# Patient Record
Sex: Female | Born: 1980 | Race: Black or African American | Hispanic: No | Marital: Married | Smoking: Never smoker
Health system: Southern US, Community
[De-identification: ages and names within clinical notes are randomized; demographics above are authoritative.]

## PROBLEM LIST (undated history)

## (undated) DIAGNOSIS — J45909 Unspecified asthma, uncomplicated: Secondary | ICD-10-CM

## (undated) DIAGNOSIS — I10 Essential (primary) hypertension: Secondary | ICD-10-CM

## (undated) HISTORY — DX: Essential (primary) hypertension: I10

## (undated) HISTORY — DX: Unspecified asthma, uncomplicated: J45.909

## (undated) HISTORY — PX: NO PAST SURGERIES: SHX2092

---
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
## 2002-02-27 ENCOUNTER — Inpatient Hospital Stay (HOSPITAL_COMMUNITY): Admission: AD | Admit: 2002-02-27 | Discharge: 2002-02-27 | Payer: Self-pay | Admitting: *Deleted

## 2002-07-05 ENCOUNTER — Inpatient Hospital Stay (HOSPITAL_COMMUNITY): Admission: AD | Admit: 2002-07-05 | Discharge: 2002-07-05 | Payer: Self-pay | Admitting: *Deleted

## 2002-09-23 ENCOUNTER — Emergency Department (HOSPITAL_COMMUNITY): Admission: AC | Admit: 2002-09-23 | Discharge: 2002-09-23 | Payer: Self-pay

## 2002-10-08 ENCOUNTER — Ambulatory Visit (HOSPITAL_COMMUNITY): Admission: RE | Admit: 2002-10-08 | Discharge: 2002-10-08 | Payer: Self-pay | Admitting: Obstetrics & Gynecology

## 2002-10-08 ENCOUNTER — Encounter: Payer: Self-pay | Admitting: Obstetrics & Gynecology

## 2003-01-03 ENCOUNTER — Inpatient Hospital Stay (HOSPITAL_COMMUNITY): Admission: AD | Admit: 2003-01-03 | Discharge: 2003-01-03 | Payer: Self-pay | Admitting: Obstetrics & Gynecology

## 2003-01-03 ENCOUNTER — Encounter: Payer: Self-pay | Admitting: Obstetrics & Gynecology

## 2003-01-05 ENCOUNTER — Encounter: Payer: Self-pay | Admitting: Obstetrics & Gynecology

## 2003-01-05 ENCOUNTER — Inpatient Hospital Stay (HOSPITAL_COMMUNITY): Admission: AD | Admit: 2003-01-05 | Discharge: 2003-01-05 | Payer: Self-pay | Admitting: Obstetrics & Gynecology

## 2003-02-06 ENCOUNTER — Encounter: Payer: Self-pay | Admitting: Obstetrics & Gynecology

## 2003-02-06 ENCOUNTER — Ambulatory Visit (HOSPITAL_COMMUNITY): Admission: RE | Admit: 2003-02-06 | Discharge: 2003-02-06 | Payer: Self-pay | Admitting: Obstetrics & Gynecology

## 2003-02-20 ENCOUNTER — Inpatient Hospital Stay (HOSPITAL_COMMUNITY): Admission: AD | Admit: 2003-02-20 | Discharge: 2003-02-22 | Payer: Self-pay | Admitting: Obstetrics & Gynecology

## 2003-02-27 ENCOUNTER — Emergency Department (HOSPITAL_COMMUNITY): Admission: EM | Admit: 2003-02-27 | Discharge: 2003-02-27 | Payer: Self-pay | Admitting: Emergency Medicine

## 2005-01-11 ENCOUNTER — Emergency Department (HOSPITAL_COMMUNITY): Admission: EM | Admit: 2005-01-11 | Discharge: 2005-01-11 | Payer: Self-pay | Admitting: Emergency Medicine

## 2005-01-13 ENCOUNTER — Emergency Department (HOSPITAL_COMMUNITY): Admission: EM | Admit: 2005-01-13 | Discharge: 2005-01-13 | Payer: Self-pay | Admitting: Emergency Medicine

## 2006-04-29 ENCOUNTER — Inpatient Hospital Stay (HOSPITAL_COMMUNITY): Admission: AD | Admit: 2006-04-29 | Discharge: 2006-04-29 | Payer: Self-pay | Admitting: Obstetrics & Gynecology

## 2006-08-14 ENCOUNTER — Inpatient Hospital Stay (HOSPITAL_COMMUNITY): Admission: AD | Admit: 2006-08-14 | Discharge: 2006-08-14 | Payer: Self-pay | Admitting: Obstetrics

## 2006-08-31 ENCOUNTER — Inpatient Hospital Stay (HOSPITAL_COMMUNITY): Admission: AD | Admit: 2006-08-31 | Discharge: 2006-09-02 | Payer: Self-pay | Admitting: Obstetrics

## 2009-07-28 ENCOUNTER — Emergency Department (HOSPITAL_COMMUNITY): Admission: EM | Admit: 2009-07-28 | Discharge: 2009-07-28 | Payer: Self-pay | Admitting: Emergency Medicine

## 2009-09-29 ENCOUNTER — Ambulatory Visit (HOSPITAL_COMMUNITY): Admission: RE | Admit: 2009-09-29 | Discharge: 2009-09-29 | Payer: Self-pay | Admitting: Obstetrics

## 2010-03-27 ENCOUNTER — Inpatient Hospital Stay (HOSPITAL_COMMUNITY): Admission: AD | Admit: 2010-03-27 | Discharge: 2010-03-28 | Payer: Self-pay | Admitting: Obstetrics

## 2010-04-08 ENCOUNTER — Inpatient Hospital Stay (HOSPITAL_COMMUNITY): Admission: AD | Admit: 2010-04-08 | Discharge: 2010-04-10 | Payer: Self-pay | Admitting: Obstetrics

## 2010-08-18 LAB — CBC
HCT: 32.6 % — ABNORMAL LOW (ref 36.0–46.0)
Hemoglobin: 11 g/dL — ABNORMAL LOW (ref 12.0–15.0)
Hemoglobin: 11.6 g/dL — ABNORMAL LOW (ref 12.0–15.0)
MCH: 33.9 pg (ref 26.0–34.0)
MCHC: 33.9 g/dL (ref 30.0–36.0)
Platelets: 145 10*3/uL — ABNORMAL LOW (ref 150–400)
RBC: 3.25 MIL/uL — ABNORMAL LOW (ref 3.87–5.11)
RBC: 3.4 MIL/uL — ABNORMAL LOW (ref 3.87–5.11)
RDW: 14.5 % (ref 11.5–15.5)

## 2010-08-28 LAB — POCT URINALYSIS DIP (DEVICE)
Bilirubin Urine: NEGATIVE
Ketones, ur: 40 mg/dL — AB
Specific Gravity, Urine: 1.02 (ref 1.005–1.030)
Urobilinogen, UA: 0.2 mg/dL (ref 0.0–1.0)
pH: 6 (ref 5.0–8.0)

## 2010-08-28 LAB — GC/CHLAMYDIA PROBE AMP, GENITAL
Chlamydia, DNA Probe: NEGATIVE
GC Probe Amp, Genital: NEGATIVE

## 2010-12-14 IMAGING — US US OB COMP LESS 14 WK
1 series · 14 of 28 positions shown · non-contrast
Comparison: none

OBSTETRICAL ULTRASOUND:
 This ultrasound exam was performed in the [HOSPITAL] Ultrasound Department.  The OB US report was generated in the AS system, and faxed to the ordering physician.  This report is also available in [HOSPITAL]?s AccessANYware and in [REDACTED] PACS.

[Series 1: us ob comp less 14 wks · 0.22mm/px · 14 of 28 slices shown]
[im 2/28]
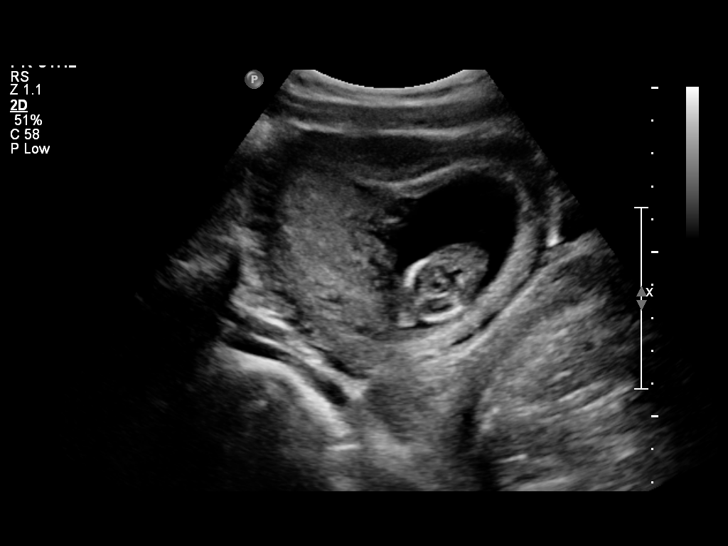
[im 4/28]
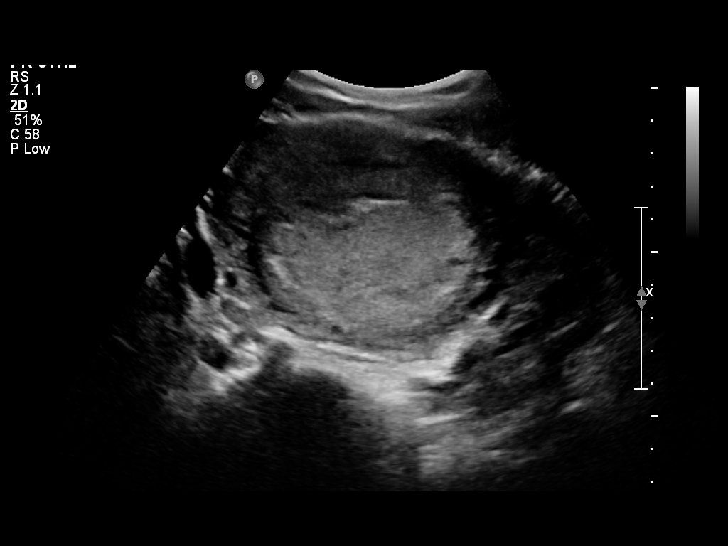
[im 6/28]
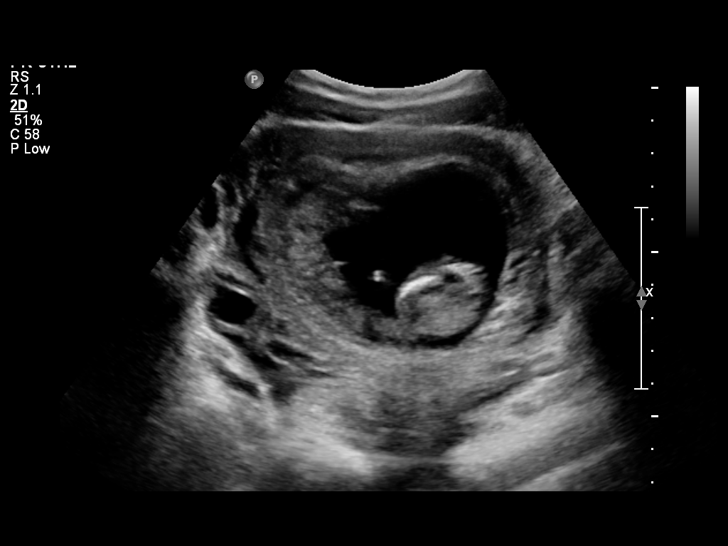
[im 8/28]
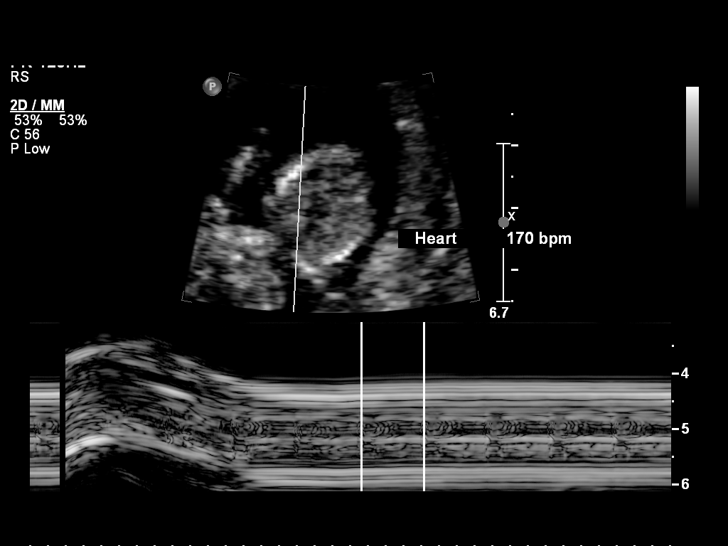
[im 10/28]
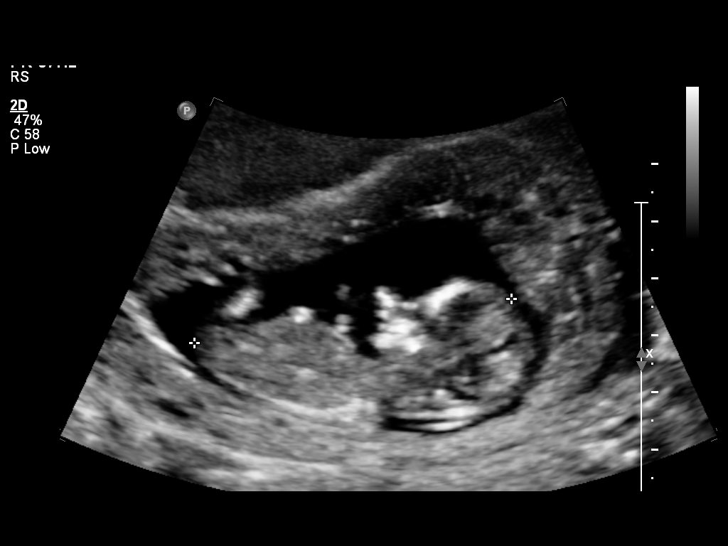
[im 12/28]
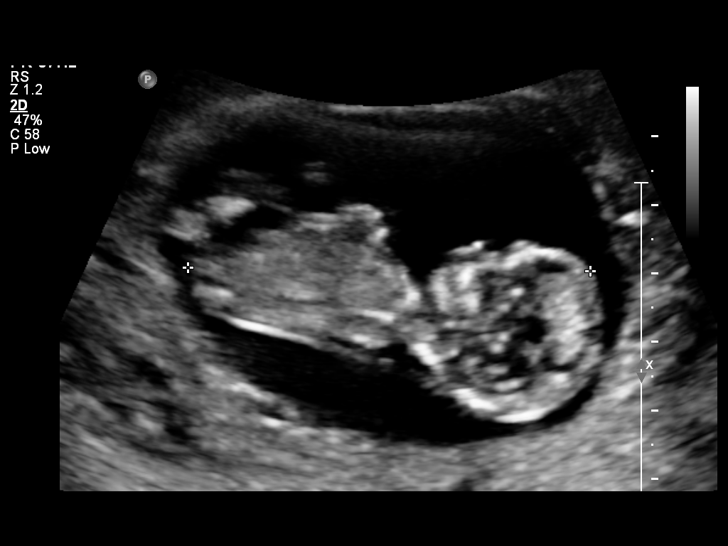
[im 14/28]
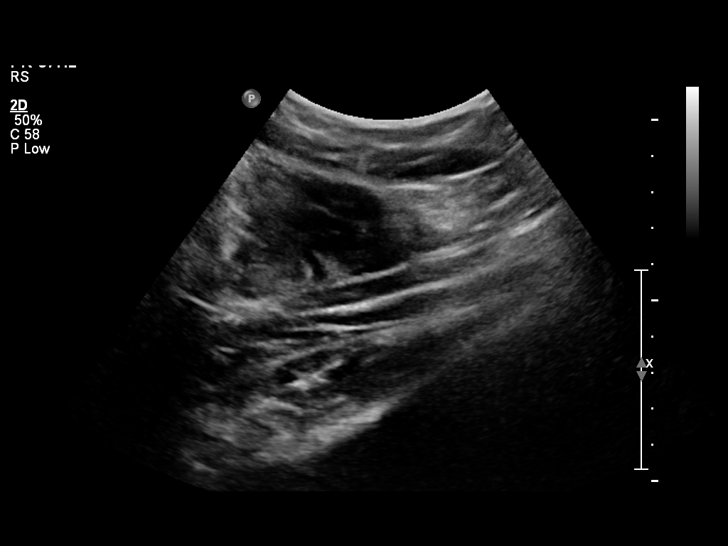
[im 16/28]
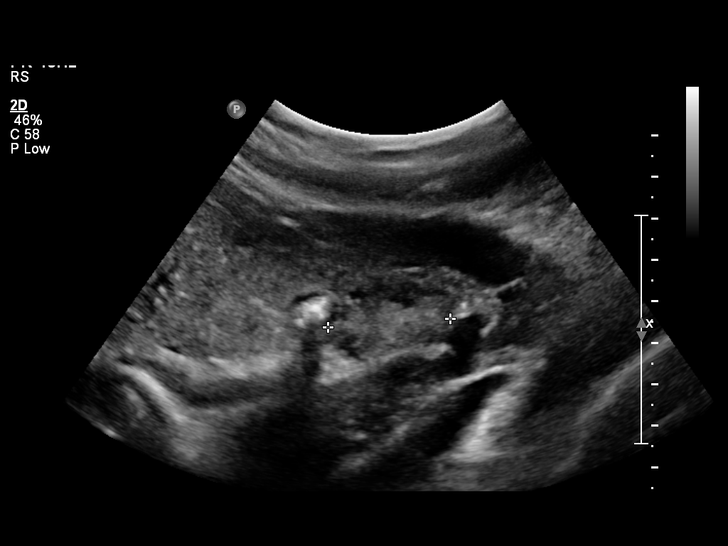
[im 18/28]
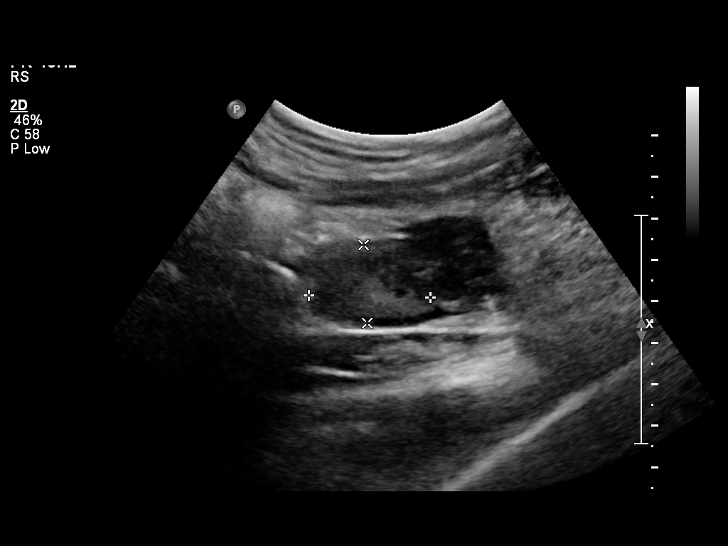
[im 20/28]
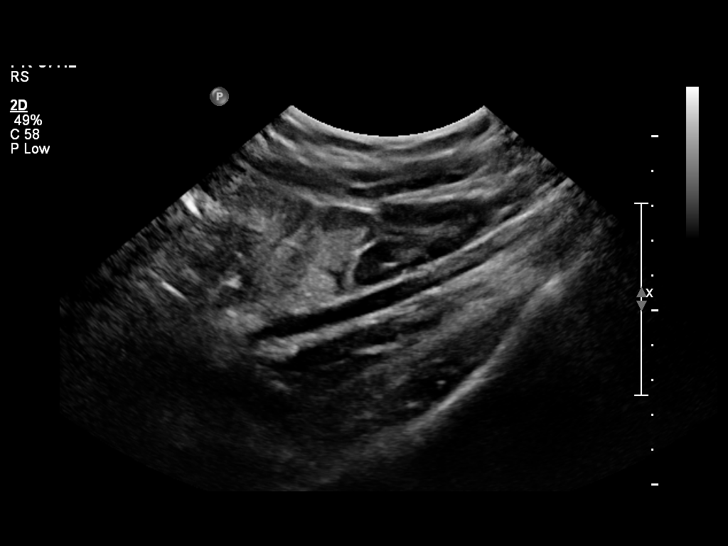
[im 22/28]
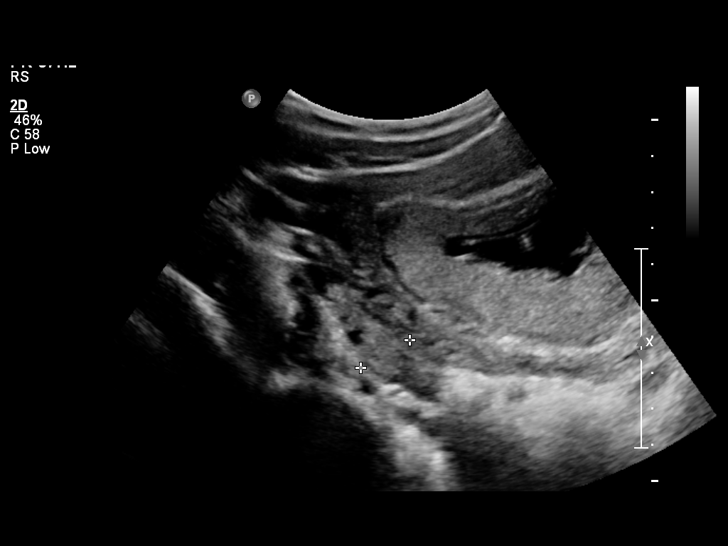
[im 24/28]
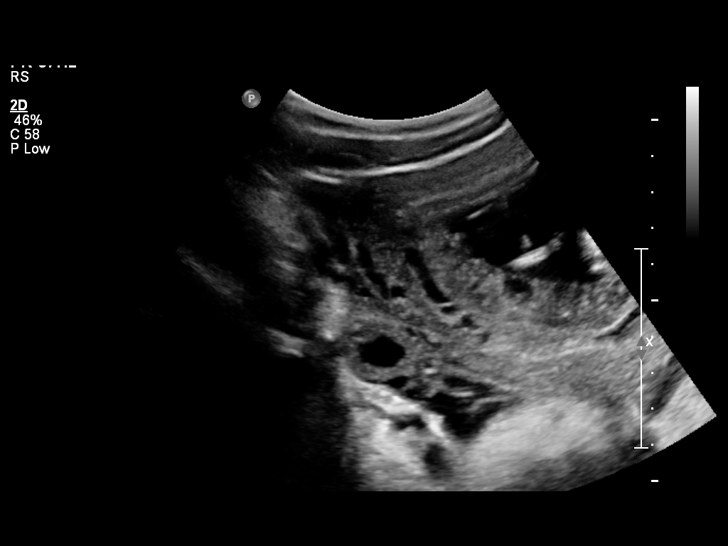
[im 26/28]
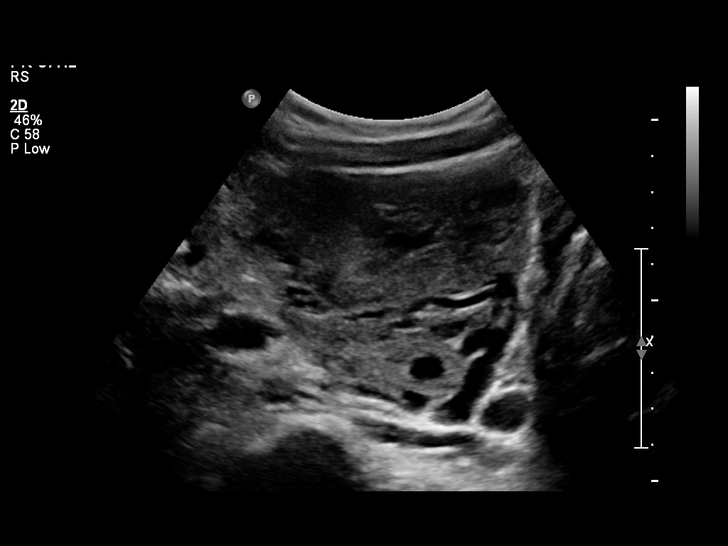
[im 28/28]
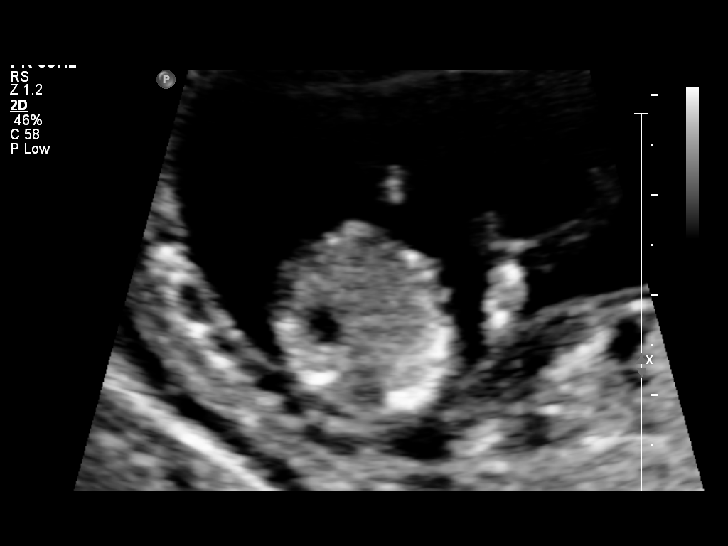

[14 of 28 positions shown; findings below may reference images not displayed]

IMPRESSION: See AS Obstetric US report.

## 2013-01-27 ENCOUNTER — Encounter (HOSPITAL_COMMUNITY): Payer: Self-pay | Admitting: Emergency Medicine

## 2013-01-27 ENCOUNTER — Emergency Department (HOSPITAL_COMMUNITY)
Admission: EM | Admit: 2013-01-27 | Discharge: 2013-01-27 | Disposition: A | Discharge status: Home / Self Care | Payer: PRIVATE HEALTH INSURANCE | Attending: Emergency Medicine | Admitting: Emergency Medicine

## 2013-01-27 DIAGNOSIS — R519 Headache, unspecified: Secondary | ICD-10-CM

## 2013-01-27 DIAGNOSIS — R109 Unspecified abdominal pain: Secondary | ICD-10-CM | POA: Insufficient documentation

## 2013-01-27 DIAGNOSIS — R51 Headache: Secondary | ICD-10-CM | POA: Insufficient documentation

## 2013-01-27 DIAGNOSIS — R1110 Vomiting, unspecified: Secondary | ICD-10-CM | POA: Insufficient documentation

## 2013-01-27 LAB — URINALYSIS, ROUTINE W REFLEX MICROSCOPIC
Bilirubin Urine: NEGATIVE
Glucose, UA: NEGATIVE mg/dL
Hgb urine dipstick: NEGATIVE
Ketones, ur: NEGATIVE mg/dL
Leukocytes, UA: NEGATIVE
Nitrite: NEGATIVE
Protein, ur: NEGATIVE mg/dL
Specific Gravity, Urine: 1.035 — ABNORMAL HIGH (ref 1.005–1.030)
Urobilinogen, UA: 0.2 mg/dL (ref 0.0–1.0)
pH: 7 (ref 5.0–8.0)

## 2013-01-27 MED ORDER — SODIUM CHLORIDE 0.9 % IV BOLUS (SEPSIS)
1000.0000 mL | Freq: Once | INTRAVENOUS | Status: AC
  Administered 2013-01-27: 1000 mL via INTRAVENOUS

## 2013-01-27 MED ORDER — METOCLOPRAMIDE HCL 5 MG/ML IJ SOLN
10.0000 mg | Freq: Once | INTRAMUSCULAR | Status: AC
  Administered 2013-01-27: 10 mg via INTRAVENOUS
  Filled 2013-01-27: qty 2

## 2013-01-27 MED ORDER — DIPHENHYDRAMINE HCL 50 MG/ML IJ SOLN
25.0000 mg | Freq: Once | INTRAMUSCULAR | Status: AC
  Administered 2013-01-27: 25 mg via INTRAVENOUS
  Filled 2013-01-27: qty 1

## 2013-01-27 MED ORDER — KETOROLAC TROMETHAMINE 15 MG/ML IJ SOLN
15.0000 mg | Freq: Once | INTRAMUSCULAR | Status: AC
  Administered 2013-01-27: 15 mg via INTRAVENOUS
  Filled 2013-01-27: qty 1

## 2013-01-27 NOTE — ED Notes (Signed)
Pt c/o abd cramping x 2 wks.  Now c/o headache and vomiting that started this morning.

## 2013-01-27 NOTE — ED Provider Notes (Signed)
CSN: [id number]     Arrival date & time 01/27/13  1436 History     First MD Initiated Contact with Patient 01/27/13 1502     Chief Complaint  Patient presents with  . Headache  . Abdominal Cramping  . Emesis   (Consider location/radiation/quality/duration/timing/severity/associated sxs/prior Treatment) HPI  32 year old female with headache. Patient first noticed when she woke up this morning around 9:00. Describes a throbbing sensation the right temporal region. Since been relatively constant since onset. No appreciable exacerbating or relieving factors. No fevers or chills. No photophobia or other visual complaints. No neck pain or neck stiffness. Associated with nausea and vomiting. She did try taking ibuprofen but vomited shortly after. Denies any significant headache history. Denies any recent trauma. No use of blood thinning medication. No numbness, tingling or loss of strength. Also complaining of intermittent crampy abdominal pain. Does not localize. Has been gone on for 2 weeks. She says feels like menstrual cramps, but she's not currently menstruating and they normally do not last this long. Doesn't think she is pregnant. No urinary complaints. Denies vaginal bleeding or discharge. No diarrhea. No sick contacts.   History reviewed. No pertinent past medical history. History reviewed. No pertinent past surgical history. History reviewed. No pertinent family history. History  Substance Use Topics  . Smoking status: Never Smoker   . Smokeless tobacco: Not on file  . Alcohol Use: No   OB History   Grav Para Term Preterm Abortions TAB SAB Ect Mult Living                 Review of Systems  All systems reviewed and negative, other than as noted in HPI.   Allergies  Review of patient's allergies indicates not on file.  Home Medications  No current outpatient prescriptions on file. BP 125/59  Pulse 99  Temp(Src) 98.8 F (37.1 C) (Oral)  Resp 18  SpO2 100%  LMP  01/04/2013 Physical Exam  Nursing note and vitals reviewed. Constitutional: She is oriented to person, place, and time. She appears well-developed and well-nourished. No distress.  HENT:  Head: Normocephalic and atraumatic.  Eyes: Conjunctivae are normal. Right eye exhibits no discharge. Left eye exhibits no discharge.  Neck: Neck supple.  No nuchal rigidity  Cardiovascular: Normal rate, regular rhythm and normal heart sounds.  Exam reveals no gallop and no friction rub.   No murmur heard. Pulmonary/Chest: Effort normal and breath sounds normal. No respiratory distress.  Abdominal: Soft. She exhibits no distension. There is no tenderness.  Musculoskeletal: She exhibits no edema and no tenderness.  Neurological: She is alert and oriented to person, place, and time. No cranial nerve deficit. She exhibits normal muscle tone. Coordination normal.  Speech clear. Content appropriate. Good finger to nose b/l.   Skin: Skin is warm and dry. She is not diaphoretic.  Psychiatric: She has a normal mood and affect. Her behavior is normal. Thought content normal.    ED Course   Procedures (including critical care time)  Labs Reviewed  URINALYSIS, ROUTINE W REFLEX MICROSCOPIC - Abnormal; Notable for the following:    Specific Gravity, Urine 1.035 (*)    All other components within normal limits   No results found. 1. Headache     MDM  32yF with headache. Suspect primary HA. Consider emergent secondary causes such as bleed, infectious or mass but doubt. There is no history of trauma. Pt has a nonfocal neurological exam. Afebrile and neck supple. No use of blood thinning medication. Consider ocular  etiology such as acute angle closure glaucoma but doubt. Pt denies acute change in visual acuity and eye exam unremarkable. Doubt CO poisoning. No contacts with similar symptoms. Doubt venous thrombosis. Doubt carotid or vertebral arteries dissection. Symptoms completely resolved with meds. Also  intermittent abdominal cramps. Benign abdominal exam. Very low suspicion for emergent intraabdominal/pelvic process. Feel that can be safely discharged, but strict return precautions discussed. Outpt fu.   Raeford Razor, MD 02/01/13 [phone]

## 2017-06-10 ENCOUNTER — Ambulatory Visit: Payer: Self-pay | Admitting: Family Medicine

## 2017-06-13 ENCOUNTER — Encounter: Payer: Self-pay | Admitting: Family Medicine

## 2017-06-13 ENCOUNTER — Ambulatory Visit: Payer: Managed Care, Other (non HMO) | Admitting: Family Medicine

## 2017-06-13 ENCOUNTER — Other Ambulatory Visit: Payer: Self-pay

## 2017-06-13 VITALS — BP 110/64 | HR 114 | Temp 99.2°F | Resp 17 | Ht 66.0 in | Wt 163.6 lb

## 2017-06-13 DIAGNOSIS — R1030 Lower abdominal pain, unspecified: Secondary | ICD-10-CM

## 2017-06-13 DIAGNOSIS — R300 Dysuria: Secondary | ICD-10-CM | POA: Diagnosis not present

## 2017-06-13 LAB — POCT URINALYSIS DIP (MANUAL ENTRY)
BILIRUBIN UA: NEGATIVE
BILIRUBIN UA: NEGATIVE mg/dL
GLUCOSE UA: NEGATIVE mg/dL
Nitrite, UA: NEGATIVE
PH UA: 6 (ref 5.0–8.0)
Protein Ur, POC: NEGATIVE mg/dL
SPEC GRAV UA: 1.015 (ref 1.010–1.025)
Urobilinogen, UA: 0.2 E.U./dL

## 2017-06-13 MED ORDER — NITROFURANTOIN MONOHYD MACRO 100 MG PO CAPS: 100.0000 mg | ORAL_CAPSULE | Freq: Two times a day (BID) | ORAL | 0 refills | Status: DC

## 2017-06-13 NOTE — Progress Notes (Signed)
Chief Complaint  Patient presents with  . New Patient (Initial Visit)    intermittent lower abdominal pain x 1 month, pain is sharp and crampy and comes at any time, regular bm's that are firm, pain not bad enought to take medication for it.    HPI  4 review of systems  Pt reports that for the past month she has been having intermittent lower abdomimnal pain that comes every couple of days lasting an hour and feels like pressure and is worse with urination  No fevers or chills No history of recurrent uti or pylo  Patient's last menstrual period was 05/26/2017.  She uses condoms [id number] No abdominal surgerys No symptoms with BM  History reviewed. No pertinent past medical history.  Current Outpatient Medications  Medication Sig Dispense Refill  . ibuprofen (ADVIL,MOTRIN) 200 MG tablet Take 600 mg by mouth every 6 (six) hours as needed for pain.    . Vitamin D, Ergocalciferol, (DRISDOL) 50000 units CAPS capsule Take 50,000 Units by mouth every 7 (seven) days.    . nitrofurantoin, macrocrystal-monohydrate, (MACROBID) 100 MG capsule Take 1 capsule (100 mg total) by mouth 2 (two) times daily for 7 days. 14 capsule 0   No current facility-administered medications for this visit.     Allergies: No Known Allergies  History reviewed. No pertinent surgical history.  Social History   Socioeconomic History  . Marital status: Married    Spouse name: None  . Number of children: None  . Years of education: None  . Highest education level: None  Social Needs  . Financial resource strain: Not hard at all  . Food insecurity - worry: Never true  . Food insecurity - inability: Never true  . Transportation needs - medical: No  . Transportation needs - non-medical: No  Occupational History  . None  Tobacco Use  . Smoking status: Never Smoker  . Smokeless tobacco: Never Used  Substance and Sexual Activity  . Alcohol use: No  . Drug use: No  . Sexual activity: Yes  Other Topics  Concern  . None  Social History Narrative  . None    Family History  Problem Relation Age of Onset  . Cancer Mother   . Hypertension Mother   . Hypertension Paternal Grandmother      ROS Review of Systems See HPI Constitution: No fevers or chills No malaise No diaphoresis Skin: No rash or itching Eyes: no blurry vision, no double vision GU: no dysuria or hematuria Neuro: no dizziness or headaches * all others reviewed and negative   Objective: Vitals:   06/13/17 1545  BP: 110/64  Pulse: (!) 114  Resp: 17  Temp: 99.2 F (37.3 C)  TempSrc: Oral  SpO2: 97%  Weight: 163 lb 9.6 oz (74.2 kg)  Height: 5\' 6"  (1.676 m)    Physical Exam Physical Exam  Constitutional: She is oriented to person, place, and time. She appears well-developed and well-nourished.  HENT:  Head: Normocephalic and atraumatic.  Eyes: Conjunctivae and EOM are normal.  Cardiovascular: Normal rate, regular rhythm and normal heart sounds.   Pulmonary/Chest: Effort normal and breath sounds normal. No respiratory distress. She has no wheezes.  Abdominal: Normal appearance and bowel sounds are normal. There is no tenderness. There is no CVA tenderness.  Neurological: She is alert and oriented to person, place, and time.   UA with trace blood and trace LE  Assessment and Plan Kadia was seen today for new patient (initial visit).  Diagnoses and all  orders for this visit:  Lower abdominal pain- will treat empirically for UTI based on symptoms and history -     POCT urinalysis dipstick -     Urine Culture  Dysuria- will rule out uti and start empiric treatment -     Urine Culture  Other orders -     Cancel: Tdap vaccine greater than or equal to 7yo IM -     Cancel: Flu Vaccine QUAD 36+ mos IM -     nitrofurantoin, macrocrystal-monohydrate, (MACROBID) 100 MG capsule; Take 1 capsule (100 mg total) by mouth 2 (two) times daily for 7 days.     Saundra Gin A Erynne Kealey

## 2017-06-13 NOTE — Patient Instructions (Addendum)
   IF you received an x-ray today, you will receive an invoice from [facility]. Please contact [facility] at [phone] with questions or concerns regarding your invoice.   IF you received labwork today, you will receive an invoice from [facility]. Please contact [facility] at [phone] with questions or concerns regarding your invoice.   Our billing staff will not be able to assist you with questions regarding bills from these companies.  You will be contacted with the lab results as soon as they are available. The fastest way to get your results is to activate your My Chart account. Instructions are located on the last page of this paperwork. If you have not heard from us regarding the results in 2 weeks, please contact this office.    Dysuria Dysuria is pain or discomfort while urinating. The pain or discomfort may be felt in the tube that carries urine out of the bladder (urethra) or in the surrounding tissue of the genitals. The pain may also be felt in the groin area, lower abdomen, and lower back. You may have to urinate frequently or have the sudden feeling that you have to urinate (urgency). Dysuria can affect both men and women, but is more common in women. Dysuria can be caused by many different things, including:  Urinary tract infection in women.  Infection of the kidney or bladder.  Kidney stones or bladder stones.  Certain sexually transmitted infections (STIs), such as chlamydia.  Dehydration.  Inflammation of the vagina.  Use of certain medicines.  Use of certain soaps or scented products that cause irritation.  Follow these instructions at home: Watch your dysuria for any changes. The following actions may help to reduce any discomfort you are feeling:  Drink enough fluid to keep your urine clear or pale yellow.  Empty your bladder often. Avoid holding urine for long periods of time.  After a bowel movement or urination, women should  cleanse from front to back, using each tissue only once.  Empty your bladder after sexual intercourse.  Take medicines only as directed by your health care provider.  If you were prescribed an antibiotic medicine, finish it all even if you start to feel better.  Avoid caffeine, tea, and alcohol. They can irritate the bladder and make dysuria worse. In men, alcohol may irritate the prostate.  Keep all follow-up visits as directed by your health care provider. This is important.  If you had any tests done to find the cause of dysuria, it is your responsibility to obtain your test results. Ask the lab or department performing the test when and how you will get your results. Talk with your health care provider if you have any questions about your results.  Contact a health care provider if:  You develop pain in your back or sides.  You have a fever.  You have nausea or vomiting.  You have blood in your urine.  You are not urinating as often as you usually do. Get help right away if:  You pain is severe and not relieved with medicines.  You are unable to hold down any fluids.  You or someone else notices a change in your mental function.  You have a rapid heartbeat at rest.  You have shaking or chills.  You feel extremely weak. This information is not intended to replace advice given to you by your health care provider. Make sure you discuss any questions you have with your health care provider. Document Released: 02/20/2004 Document   Revised: 10/30/2015 Document Reviewed: 01/17/2014 Elsevier Interactive Patient Education  2018 [employer]vier Inc.  

## 2017-06-15 LAB — URINE CULTURE

## 2017-06-16 ENCOUNTER — Other Ambulatory Visit: Payer: Self-pay | Admitting: Family Medicine

## 2017-06-16 MED ORDER — AMOXICILLIN 500 MG PO CAPS: 500.0000 mg | ORAL_CAPSULE | Freq: Three times a day (TID) | ORAL | 0 refills | Status: AC

## 2017-06-16 NOTE — Progress Notes (Signed)
Culture showed Beta hemolytic strep Amoxicillin sent in

## 2018-01-21 ENCOUNTER — Ambulatory Visit: Payer: Managed Care, Other (non HMO) | Admitting: Family Medicine

## 2018-01-21 ENCOUNTER — Encounter: Payer: Self-pay | Admitting: Family Medicine

## 2018-01-21 ENCOUNTER — Other Ambulatory Visit: Payer: Self-pay

## 2018-01-21 VITALS — BP 117/76 | HR 103 | Temp 98.5°F | Resp 16 | Ht 66.0 in | Wt 161.0 lb

## 2018-01-21 DIAGNOSIS — R300 Dysuria: Secondary | ICD-10-CM

## 2018-01-21 DIAGNOSIS — Z113 Encounter for screening for infections with a predominantly sexual mode of transmission: Secondary | ICD-10-CM | POA: Diagnosis not present

## 2018-01-21 DIAGNOSIS — Z124 Encounter for screening for malignant neoplasm of cervix: Secondary | ICD-10-CM

## 2018-01-21 DIAGNOSIS — Z0000 Encounter for general adult medical examination without abnormal findings: Secondary | ICD-10-CM | POA: Diagnosis not present

## 2018-01-21 DIAGNOSIS — R1030 Lower abdominal pain, unspecified: Secondary | ICD-10-CM | POA: Diagnosis not present

## 2018-01-21 LAB — POCT URINALYSIS DIP (MANUAL ENTRY)
BILIRUBIN UA: NEGATIVE
Glucose, UA: NEGATIVE mg/dL
Ketones, POC UA: NEGATIVE mg/dL
Leukocytes, UA: NEGATIVE
Nitrite, UA: NEGATIVE
Protein Ur, POC: NEGATIVE mg/dL
SPEC GRAV UA: 1.025 (ref 1.010–1.025)
Urobilinogen, UA: 0.2 E.U./dL
pH, UA: 5.5 (ref 5.0–8.0)

## 2018-01-21 NOTE — Patient Instructions (Addendum)
If you have lab work done today you will be contacted with your lab results within the next 2 weeks.  If you have not heard from [place] then please contact us. The fastest way to get your results is to register for My Chart.   IF you received an x-ray today, you will receive an invoice from [facility]. Please contact [facility] at [phone] with questions or concerns regarding your invoice.   IF you received labwork today, you will receive an invoice from [facility]. Please contact [facility] at [phone] with questions or concerns regarding your invoice.   Our billing staff will not be able to assist you with questions regarding bills from these companies.  You will be contacted with the lab results as soon as they are available. The fastest way to get your results is to activate your My Chart account. Instructions are located on the last page of this paperwork. If you have not heard from [place] regarding the results in 2 weeks, please contact this office.     Cancer Screening for Women A cancer screening is a test or exam that checks for cancer. Your health care provider will recommend specific cancer screenings based on your age, personal history, and family history of cancer. Work with your health care provider to create a cancer screening schedule that protects your health. Why is cancer screening done? Cancer screening is done to look for cancer in the very early stages, before it spreads and becomes harder to treat and before you would start to notice symptoms. Finding cancer early improves the chances of successful treatment. It may save your life. Who should be screened for cancer? All women should be screened for certain cancers, including breast cancer, cervical cancer, and skin cancer. Your health care provider may recommend screenings for other types of cancer if:  You had cancer before.  You have a family member with cancer.  You have abnormal genes that  could increase the risk of cancer.  You have risk factors for certain cancers, such as smoking.  When you should be screened for cancer depends on:  Your age.  Your medical history and your family's medical history.  Certain lifestyle factors, such as smoking.  Environmental exposure, such as to asbestos.  What are some common cancer screenings? Breast cancer Breast cancer screening is done with a test that takes images of breast tissue (mammogram). Here are some screening guidelines:  When you are age 2-44. you will be given the choice to start having mammograms.  When you are age 61-54, you should have a mammogram every year.  You may start having mammograms before age 79 if you have risk factors for breast cancer, such as having an immediate family member with breast cancer.  When you are age 13 or older, you should have a mammogram every 1-2 years for as long as you are in good health and have a life expectancy of 10 years or more.  It is important to know what your breasts look and feel like so you can report any changes to your health care provider.  Cervical cancer Cervical cancer screening is done with a Pap test. This testchecks for abnormalities, including the virus that causes cervical cancer (human papillomavirus, or HPV). To perform the test, a health care provider takes a swab of cervical cells during a pelvic exam. Screening for cervical cancer with a Pap test should start at age 61. Here are some screening guidelines:  When you  are age 21-29, you should have a Pap test every 3 years.  When you are age 27-65, you should have a Pap test and HPV test every 5 years or have a Pap test every 3 years.  You may be screened for cervical cancer more often if you have risk factors for cervical cancer.  If your Pap tests are abnormal, you may have an HPV test.  If you have had the HPV vaccine, you will still be screened for cervical cancer and follow normal screening  recommendations.  You do not need to be screened for cervical cancer if any of the following apply to you:  You are older than age 3 and you have not had a serious cervical precancer or cancer in the last 20 years.  Your cervix and uterus have been removed and you have never had cervical cancer or precancerous cells.  Endometrial cancer There is no standard screening test for endometrial cancer, but the cancer can be detected with:  A test of a sample of tissue taken from the lining of the uterus (endometrial tissue biopsy).  A vaginal ultrasound.  Pap tests.  If you are at increased risk for endometrial cancer, you may need to have these tests more often than normal. You are at increased risk if:  You have a family history of ovarian, uterine, or colon cancer.  You are taking tamoxifen, a drug that is used to treat breast cancer.  You have certain types of colon cancer.  If you have reached menopause, it is especially important to talk with your health care provider about any vaginal bleeding or spotting. Screening for endometrial cancer is not recommended for women who do not have symptoms of the cancer, such as vaginal bleeding. Colorectal cancer Screening for colorectal cancer is recommended starting at age 51 for most women. If you have a family history of colon or rectal cancer or other risk factors, you may need to start having screenings earlier. Talk with your health care provider about which screening test is right for you and how often you should be screened. Colorectal cancer screening looks for cancer or for growths called polyps that often form before cancer starts. Tests to look for cancer or polyps include:  Colonoscopy or flexible sigmoidoscopy. For these procedures, a flexible tube with a small camera is inserted into the rectum.  CT colonography. This test uses X-rays and a contrast dye to check the colon for polyps. If a polyp is found, you may need to have a  colonoscopy so the polyp can be located and removed.  Tests to look for cancer in the stool (feces) include:  Guaiac-based fecal occult blood test (FOBT). This test detects blood in stool. It can be done at home with a kit.  Fecal immunochemical test (FIT). This test detects blood in stool. For this test, you will need to collect stool samples at home.  Stool DNA test. This test looks for blood in stool and any changes in DNA that can lead to colon cancer. For this test, you will need to collect a stool sample at home and send it to a lab.  Skin cancer Skin cancer screening is done by checking the skin for unusual moles or spots and any changes in existing moles. Your health care provider should check your skin for signs of skin cancer at every physical exam. You should check your skin every month and tell your health care provider right away if anything looks unusual. Women with  a higher-than-normal risk for skin cancer may want to see a skin specialist (dermatologist) for an annual body check. Lung cancer Lung cancer screening is done with a CT scan that looks for abnormal cells in the lungs. Discuss lung cancer screening with your health care provider if you are 78-32 years old and if any of the following apply to you:  You currently smoke.  You used to smoke heavily.  You have had at least a 30-pack-year smoking history.  You have quit smoking within the past 15 years.  If you smoke heavily or if you used to smoke, you may need to be screened every year. Where to find more information:  [place]: SkinPromotion.no  Centers for Disease Control and Prevention: http://knight-sullivan.biz/  Department of Health and Human Services: BankingDetective.si Contact a health care provider if:  You have concerns about any signs or symptoms of cancer, such  as: ? Moles that have an unusual shape or color. ? Changes in existing moles. ? A sore on your skin that does not heal. ? Blood in your stool. ? Fatigue that does not go away. ? Frequent pain or cramping in your abdomen. ? Coughing, or coughing up blood. ? Losing weight without trying. ? Lumps or other changes in your breasts. ? Vaginal bleeding, spotting, or changes in your periods. Summary  Be aware of and watch for signs and symptoms of cancer, especially symptoms of breast cancer, cervical cancer, endometrial cancer, colorectal cancer, skin cancer, and lung cancer.  Early detection of cancer with cancer screening may save your life.  Talk with your health care provider about your specific cancer risks.  Work together with your health care provider to create a cancer screening plan that is right for you. This information is not intended to replace advice given to you by your health care provider. Make sure you discuss any questions you have with your health care provider. Document Released: 02/19/2016 Document Revised: 02/19/2016 Document Reviewed: 02/19/2016 Elsevier Interactive Patient Education  [employer].

## 2018-01-21 NOTE — Progress Notes (Signed)
Chief Complaint  Patient presents with  . 6 month f/u/ pap smear    ? uti some dysuria but intermittent not like before per pt    Subjective:  Rachael Baker is a 37 y.o. female here for a health maintenance visit.  Patient is established pt  There are no active problems to display for this patient.  She works for [employer] and reports that she has not had a physical but had full biometric screening at work They checked her lipids    No past medical history on file.  No past surgical history on file.   Outpatient Medications Prior to Visit  Medication Sig Dispense Refill  . ibuprofen (ADVIL,MOTRIN) 200 MG tablet Take 600 mg by mouth every 6 (six) hours as needed for pain.    . Vitamin D, Ergocalciferol, (DRISDOL) 50000 units CAPS capsule Take 50,000 Units by mouth every 7 (seven) days.     No facility-administered medications prior to visit.     No Known Allergies   Family History  Problem Relation Age of Onset  . Cancer Mother   . Hypertension Mother   . Hypertension Paternal Grandmother      Health Habits: Dental Exam: up to date Eye Exam: up to date Exercise: 2 times/week on average Current exercise activities: walking/running Diet: mostly fast food   Social History   Socioeconomic History  . Marital status: Married    Spouse name: Not on file  . Number of children: Not on file  . Years of education: Not on file  . Highest education level: Not on file  Occupational History  . Not on file  Social Needs  . Financial resource strain: Not hard at all  . Food insecurity:    Worry: Never true    Inability: Never true  . Transportation needs:    Medical: No    Non-medical: No  Tobacco Use  . Smoking status: Never Smoker  . Smokeless tobacco: Never Used  Substance and Sexual Activity  . Alcohol use: No  . Drug use: No  . Sexual activity: Yes  Lifestyle  . Physical activity:    Days per week: 3 days    Minutes per session: 30 min  . Stress: To  some extent  Relationships  . Social connections:    Talks on phone: Three times a week    Gets together: Three times a week    Attends religious service: More than 4 times per year    Active member of club or organization: Yes    Attends meetings of clubs or organizations: More than 4 times per year    Relationship status: Married  . Intimate partner violence:    Fear of current or ex partner: No    Emotionally abused: No    Physically abused: No    Forced sexual activity: No  Other Topics Concern  . Not on file  Social History Narrative  . Not on file   Social History   Substance and Sexual Activity  Alcohol Use No   Social History   Tobacco Use  Smoking Status Never Smoker  Smokeless Tobacco Never Used   Social History   Substance and Sexual Activity  Drug Use No    GYN: Sexual Health Menstrual status: regular menses LMP: Patient's last menstrual period was 01/14/2018. Last pap smear: see HM section History of abnormal pap smears:  Sexually active: with female partner Current contraception: condoms   Health Maintenance: See under health Maintenance activity  for review of completion dates as well.  There is no immunization history on file for this patient.    Depression Screen-PHQ2/9 Depression screen [facility] 2/9 01/21/2018  Decreased Interest 0  Down, Depressed, Hopeless 0  PHQ - 2 Score 0       Depression Severity and Treatment Recommendations:  0-4= None  5-9= Mild / Treatment: Support, educate to call if worse; return in one month  10-14= Moderate / Treatment: Support, watchful waiting; Antidepressant or Psycotherapy  15-19= Moderately severe / Treatment: Antidepressant OR Psychotherapy  >= 20 = Major depression, severe / Antidepressant AND Psychotherapy    Review of Systems   Review of Systems  Constitutional: Negative for chills and fever.  Eyes: Negative for blurred vision and double vision.  Cardiovascular: Negative for chest pain,  palpitations and orthopnea.  Gastrointestinal: Positive for abdominal pain. Negative for heartburn, nausea and vomiting.  Skin: Negative for itching and rash.  Neurological: Negative for dizziness, tingling and headaches.    See HPI for ROS as well.    Objective:   Vitals:   01/21/18 1111  BP: 117/76  Pulse: (!) 103  Resp: 16  Temp: 98.5 F (36.9 C)  TempSrc: Oral  SpO2: 99%  Weight: 161 lb (73 kg)  Height: _0  (1.676 m)    Body mass index is 25.99 kg/m.  Physical Exam  Constitutional: She is oriented to person, place, and time. She appears well-developed and well-nourished.  HENT:  Head: Normocephalic and atraumatic.  Eyes: Conjunctivae and EOM are normal.  Neck: Normal range of motion. Neck supple. No thyromegaly present.  Cardiovascular: Normal rate, regular rhythm and normal heart sounds.  No murmur heard. Pulmonary/Chest: Effort normal and breath sounds normal. No stridor. No respiratory distress. She has no wheezes. She has no rales.  Neurological: She is alert and oriented to person, place, and time.  Skin: Skin is warm. Capillary refill takes less than 2 seconds.  Psychiatric: She has a normal mood and affect. Her behavior is normal. Judgment and thought content normal.    Chaperone present Visible inspection reveals normal external genitalia Spulum exam shows normal cervix - pap smear performed Bimanual exam performed without uterine tenderness or palpable mass In the RLQ there was no rebound or guarding, nontender on bimanual exam    Assessment/Plan:   Patient was seen for a health maintenance exam.  Counseled the patient on health maintenance issues. Reviewed her health mainteance schedule and ordered appropriate tests (see orders.) Counseled on regular exercise and weight management. Recommend regular eye exams and dental cleaning.   The following issues were addressed today for health maintenance:   [name] was seen today for 6 month f/u/ pap  smear.  Diagnoses and all orders for this visit:  Encounter for health maintenance examination in adult- Women's Health Maintenance Plan Advised monthly breast exam and annual mammogram Advised dental exam every six months Discussed stress management Discussed pap smear screening guidelines   Encounter for screening for cervical cancer  -     Pap IG, CT/NG NAA, and HPV (high risk) [facility]  Dysuria -     POCT urinalysis dipstick  Screen for STD (sexually transmitted disease) -     HIV antibody -     Hepatitis B surface antigen -     RPR  Abdominal pain - with benign exam and no sign of inflammation would not recommend further work up at this time  Return in about 1 year (around 01/22/2019).    Body mass  index is 25.99 kg/m.:  Discussed the patient's BMI with patient. The BMI body mass index is 25.99 kg/m.     No future appointments.  Patient Instructions       If you have lab work done today you will be contacted with your lab results within the next 2 weeks.  If you have not heard from [place] then please contact us. The fastest way to get your results is to register for My Chart.   IF you received an x-ray today, you will receive an invoice from [facility]. Please contact [facility] at [phone] with questions or concerns regarding your invoice.   IF you received labwork today, you will receive an invoice from [facility]. Please contact [facility] at [phone] with questions or concerns regarding your invoice.   Our billing staff will not be able to assist you with questions regarding bills from these companies.  You will be contacted with the lab results as soon as they are available. The fastest way to get your results is to activate your My Chart account. Instructions are located on the last page of this paperwork. If you have not heard from [place] regarding the results in 2 weeks, please contact this office.     Cancer Screening for  Women A cancer screening is a test or exam that checks for cancer. Your health care provider will recommend specific cancer screenings based on your age, personal history, and family history of cancer. Work with your health care provider to create a cancer screening schedule that protects your health. Why is cancer screening done? Cancer screening is done to look for cancer in the very early stages, before it spreads and becomes harder to treat and before you would start to notice symptoms. Finding cancer early improves the chances of successful treatment. It may save your life. Who should be screened for cancer? All women should be screened for certain cancers, including breast cancer, cervical cancer, and skin cancer. Your health care provider may recommend screenings for other types of cancer if:  You had cancer before.  You have a family member with cancer.  You have abnormal genes that could increase the risk of cancer.  You have risk factors for certain cancers, such as smoking.  When you should be screened for cancer depends on:  Your age.  Your medical history and your family's medical history.  Certain lifestyle factors, such as smoking.  Environmental exposure, such as to asbestos.  What are some common cancer screenings? Breast cancer Breast cancer screening is done with a test that takes images of breast tissue (mammogram). Here are some screening guidelines:  When you are age 74-44. you will be given the choice to start having mammograms.  When you are age 74-54, you should have a mammogram every year.  You may start having mammograms before age 59 if you have risk factors for breast cancer, such as having an immediate family member with breast cancer.  When you are age 38 or older, you should have a mammogram every 1-2 years for as long as you are in good health and have a life expectancy of 10 years or more.  It is important to know what your breasts look and feel  like so you can report any changes to your health care provider.  Cervical cancer Cervical cancer screening is done with a Pap test. This testchecks for abnormalities, including the virus that causes cervical cancer (human papillomavirus, or HPV). To perform the test, a health  care provider takes a swab of cervical cells during a pelvic exam. Screening for cervical cancer with a Pap test should start at age 38. Here are some screening guidelines:  When you are age 97-29, you should have a Pap test every 3 years.  When you are age 26-65, you should have a Pap test and HPV test every 5 years or have a Pap test every 3 years.  You may be screened for cervical cancer more often if you have risk factors for cervical cancer.  If your Pap tests are abnormal, you may have an HPV test.  If you have had the HPV vaccine, you will still be screened for cervical cancer and follow normal screening recommendations.  You do not need to be screened for cervical cancer if any of the following apply to you:  You are older than age 35 and you have not had a serious cervical precancer or cancer in the last 20 years.  Your cervix and uterus have been removed and you have never had cervical cancer or precancerous cells.  Endometrial cancer There is no standard screening test for endometrial cancer, but the cancer can be detected with:  A test of a sample of tissue taken from the lining of the uterus (endometrial tissue biopsy).  A vaginal ultrasound.  Pap tests.  If you are at increased risk for endometrial cancer, you may need to have these tests more often than normal. You are at increased risk if:  You have a family history of ovarian, uterine, or colon cancer.  You are taking tamoxifen, a drug that is used to treat breast cancer.  You have certain types of colon cancer.  If you have reached menopause, it is especially important to talk with your health care provider about any vaginal bleeding or  spotting. Screening for endometrial cancer is not recommended for women who do not have symptoms of the cancer, such as vaginal bleeding. Colorectal cancer Screening for colorectal cancer is recommended starting at age 9 for most women. If you have a family history of colon or rectal cancer or other risk factors, you may need to start having screenings earlier. Talk with your health care provider about which screening test is right for you and how often you should be screened. Colorectal cancer screening looks for cancer or for growths called polyps that often form before cancer starts. Tests to look for cancer or polyps include:  Colonoscopy or flexible sigmoidoscopy. For these procedures, a flexible tube with a small camera is inserted into the rectum.  CT colonography. This test uses X-rays and a contrast dye to check the colon for polyps. If a polyp is found, you may need to have a colonoscopy so the polyp can be located and removed.  Tests to look for cancer in the stool (feces) include:  Guaiac-based fecal occult blood test (FOBT). This test detects blood in stool. It can be done at home with a kit.  Fecal immunochemical test (FIT). This test detects blood in stool. For this test, you will need to collect stool samples at home.  Stool DNA test. This test looks for blood in stool and any changes in DNA that can lead to colon cancer. For this test, you will need to collect a stool sample at home and send it to a lab.  Skin cancer Skin cancer screening is done by checking the skin for unusual moles or spots and any changes in existing moles. Your health care provider should check  your skin for signs of skin cancer at every physical exam. You should check your skin every month and tell your health care provider right away if anything looks unusual. Women with a higher-than-normal risk for skin cancer may want to see a skin specialist (dermatologist) for an annual body check. Lung cancer Lung  cancer screening is done with a CT scan that looks for abnormal cells in the lungs. Discuss lung cancer screening with your health care provider if you are 41-31 years old and if any of the following apply to you:  You currently smoke.  You used to smoke heavily.  You have had at least a 30-pack-year smoking history.  You have quit smoking within the past 15 years.  If you smoke heavily or if you used to smoke, you may need to be screened every year. Where to find more information:  [place]: SkinPromotion.no  Centers for Disease Control and Prevention: http://knight-sullivan.biz/  Department of Health and Human Services: BankingDetective.si Contact a health care provider if:  You have concerns about any signs or symptoms of cancer, such as: ? Moles that have an unusual shape or color. ? Changes in existing moles. ? A sore on your skin that does not heal. ? Blood in your stool. ? Fatigue that does not go away. ? Frequent pain or cramping in your abdomen. ? Coughing, or coughing up blood. ? Losing weight without trying. ? Lumps or other changes in your breasts. ? Vaginal bleeding, spotting, or changes in your periods. Summary  Be aware of and watch for signs and symptoms of cancer, especially symptoms of breast cancer, cervical cancer, endometrial cancer, colorectal cancer, skin cancer, and lung cancer.  Early detection of cancer with cancer screening may save your life.  Talk with your health care provider about your specific cancer risks.  Work together with your health care provider to create a cancer screening plan that is right for you. This information is not intended to replace advice given to you by your health care provider. Make sure you discuss any questions you have with your health care provider. Document Released: 02/19/2016  Document Revised: 02/19/2016 Document Reviewed: 02/19/2016 Elsevier Interactive Patient Education  [employer].

## 2018-01-22 LAB — HEPATITIS B SURFACE ANTIGEN: Hepatitis B Surface Ag: NEGATIVE

## 2018-01-22 LAB — HIV ANTIBODY (ROUTINE TESTING W REFLEX): HIV Screen 4th Generation wRfx: NONREACTIVE

## 2018-01-22 LAB — RPR: RPR Ser Ql: NONREACTIVE

## 2018-02-02 LAB — PAP IG, CT-NG NAA, HPV HIGH-RISK
Chlamydia, Nuc. Acid Amp: NEGATIVE
Gonococcus by Nucleic Acid Amp: NEGATIVE
HPV, HIGH-RISK: NEGATIVE
PAP SMEAR COMMENT: 0

## 2018-06-28 ENCOUNTER — Encounter: Payer: Self-pay | Admitting: Family Medicine

## 2018-07-10 ENCOUNTER — Encounter: Payer: Self-pay | Admitting: Family Medicine

## 2019-08-27 LAB — HM MAMMOGRAPHY: HM Mammogram: NORMAL (ref 0–4)

## 2020-03-18 ENCOUNTER — Ambulatory Visit (INDEPENDENT_AMBULATORY_CARE_PROVIDER_SITE_OTHER): Payer: Managed Care, Other (non HMO)

## 2020-03-18 ENCOUNTER — Other Ambulatory Visit: Payer: Self-pay

## 2020-03-18 VITALS — BP 137/83 | HR 106 | Ht 66.0 in | Wt 168.0 lb

## 2020-03-18 DIAGNOSIS — Z3480 Encounter for supervision of other normal pregnancy, unspecified trimester: Secondary | ICD-10-CM | POA: Diagnosis not present

## 2020-03-18 DIAGNOSIS — Z3201 Encounter for pregnancy test, result positive: Secondary | ICD-10-CM

## 2020-03-18 DIAGNOSIS — O09529 Supervision of elderly multigravida, unspecified trimester: Secondary | ICD-10-CM | POA: Insufficient documentation

## 2020-03-18 LAB — POCT URINE PREGNANCY: Preg Test, Ur: POSITIVE — AB

## 2020-03-18 NOTE — Progress Notes (Signed)
Rachael Baker presents today for UPT. She has no unusual complaints.  LMP:02/06/2020 EDD:11/12/20 [place]   : [redacted]w[redacted]d    OBJECTIVE: Appears well, in no apparent distress.  OB History    Gravida  4   Para  3   Term  3   Preterm      AB      Living  3     SAB      TAB      Ectopic      Multiple      Live Births  3          Home UPT Result: POSITIVE In-Office UPT result: POSITIVE  I have reviewed the patient's medical, obstetrical, social, and family histories, and medications.   ASSESSMENT: Positive pregnancy test  PLAN Prenatal care to be completed at: [facility]

## 2020-03-18 NOTE — Progress Notes (Signed)
PRENATAL INTAKE SUMMARY  Ms. Thien presents today New OB Nurse Interview.  OB History    Gravida  4   Para  3   Term  3   Preterm      AB      Living  3     SAB      TAB      Ectopic      Multiple      Live Births  3          I have reviewed the patient's medical, obstetrical, social, and family histories, medications, and available lab results.  SUBJECTIVE She has no unusual complaints  OBJECTIVE Initial Nurse Intake (New OB)  GENERAL APPEARANCE: alert, well appearing   ASSESSMENT Normal pregnancy LMP 02/06/2020; EDD 11/12/2020;  [place]  [redacted]w[redacted]d PHQ-9=1  PLAN Prenatal care at [facility] Pnl/HIV labs will be done at [facility] visit BP Cuff already owned

## 2020-03-19 NOTE — Progress Notes (Signed)
Patient was assessed and managed by nursing staff during this encounter. I have reviewed the chart and agree with the documentation and plan. I have also made any necessary editorial changes.  Catalina Antigua, MD 03/19/2020 9:04 AM

## 2020-04-28 ENCOUNTER — Inpatient Hospital Stay (HOSPITAL_COMMUNITY)
Admission: EM | Admit: 2020-04-28 | Discharge: 2020-04-28 | Disposition: A | Discharge status: Home / Self Care | Payer: Managed Care, Other (non HMO) | Attending: Obstetrics & Gynecology | Admitting: Obstetrics & Gynecology

## 2020-04-28 ENCOUNTER — Ambulatory Visit (INDEPENDENT_AMBULATORY_CARE_PROVIDER_SITE_OTHER): Payer: Managed Care, Other (non HMO) | Admitting: Obstetrics and Gynecology

## 2020-04-28 ENCOUNTER — Encounter: Payer: Self-pay | Admitting: Obstetrics and Gynecology

## 2020-04-28 ENCOUNTER — Encounter (HOSPITAL_COMMUNITY): Payer: Self-pay | Admitting: Emergency Medicine

## 2020-04-28 ENCOUNTER — Other Ambulatory Visit: Payer: Self-pay

## 2020-04-28 ENCOUNTER — Inpatient Hospital Stay (HOSPITAL_COMMUNITY): Payer: Managed Care, Other (non HMO)

## 2020-04-28 ENCOUNTER — Other Ambulatory Visit (HOSPITAL_COMMUNITY)
Admission: RE | Admit: 2020-04-28 | Discharge: 2020-04-28 | Disposition: A | Discharge status: Home / Self Care | Payer: Managed Care, Other (non HMO) | Source: Ambulatory Visit | Attending: Obstetrics and Gynecology | Admitting: Obstetrics and Gynecology

## 2020-04-28 VITALS — BP 139/82 | HR 108 | Wt 169.3 lb

## 2020-04-28 DIAGNOSIS — Z3480 Encounter for supervision of other normal pregnancy, unspecified trimester: Secondary | ICD-10-CM

## 2020-04-28 DIAGNOSIS — O209 Hemorrhage in early pregnancy, unspecified: Secondary | ICD-10-CM | POA: Diagnosis present

## 2020-04-28 DIAGNOSIS — Z3A11 11 weeks gestation of pregnancy: Secondary | ICD-10-CM | POA: Diagnosis not present

## 2020-04-28 DIAGNOSIS — O09521 Supervision of elderly multigravida, first trimester: Secondary | ICD-10-CM

## 2020-04-28 DIAGNOSIS — O09529 Supervision of elderly multigravida, unspecified trimester: Secondary | ICD-10-CM | POA: Insufficient documentation

## 2020-04-28 LAB — WET PREP, GENITAL
Clue Cells Wet Prep HPF POC: NONE SEEN
Sperm: NONE SEEN
Trich, Wet Prep: NONE SEEN
Yeast Wet Prep HPF POC: NONE SEEN

## 2020-04-28 LAB — CBC
HCT: 36.9 % (ref 36.0–46.0)
Hemoglobin: 12.4 g/dL (ref 12.0–15.0)
MCH: 31.6 pg (ref 26.0–34.0)
MCHC: 33.6 g/dL (ref 30.0–36.0)
MCV: 94.1 fL (ref 80.0–100.0)
Platelets: 232 10*3/uL (ref 150–400)
RBC: 3.92 MIL/uL (ref 3.87–5.11)
RDW: 12.8 % (ref 11.5–15.5)
WBC: 12.5 10*3/uL — ABNORMAL HIGH (ref 4.0–10.5)
nRBC: 0 % (ref 0.0–0.2)

## 2020-04-28 LAB — ABO/RH: ABO/RH(D): B POS

## 2020-04-28 LAB — HIV ANTIBODY (ROUTINE TESTING W REFLEX): HIV Screen 4th Generation wRfx: NONREACTIVE

## 2020-04-28 NOTE — MAU Note (Addendum)
..  Rachael Baker is a 39 y.o. at [redacted]w[redacted]d here in MAU reporting: Vaginal bleeding that began after she was riding a her home stationary bike around 6pm. Patient reports when it began it was a dark brown and did not soak a full pad and now it is only spotting. Denies abdominal cramping.  Last intercourse: 04/26/2020 Pain score: 0/10 Vitals:   04/28/20 2039  BP: 134/84  Pulse: (!) 103  Resp: 16  Temp: 98.5 F (36.9 C)  SpO2: 100%     FHT dopple: 154

## 2020-04-28 NOTE — ED Triage Notes (Signed)
Patient reports sudden onset vaginal bleeding while using her stationary bicycle at home this evening , she is [redacted] weeks pregnant , prenatal visit today , denies abdominal cramping .

## 2020-04-28 NOTE — Patient Instructions (Signed)
 First Trimester of Pregnancy The first trimester of pregnancy is from week 1 until the end of week 13 (months 1 through 3). A week after a sperm fertilizes an egg, the egg will implant on the wall of the uterus. This embryo will begin to develop into a baby. Genes from you and your partner will form the baby. The female genes will determine whether the baby will be a boy or a girl. At 6-8 weeks, the eyes and face will be formed, and the heartbeat can be seen on ultrasound. At the end of 12 weeks, all the baby's organs will be formed. Now that you are pregnant, you will want to do everything you can to have a healthy baby. Two of the most important things are to get good prenatal care and to follow your health care provider's instructions. Prenatal care is all the medical care you receive before the baby's birth. This care will help prevent, find, and treat any problems during the pregnancy and childbirth. Body changes during your first trimester Your body goes through many changes during pregnancy. The changes vary from woman to woman.  You may gain or lose a couple of pounds at first.  You may feel sick to your stomach (nauseous) and you may throw up (vomit). If the vomiting is uncontrollable, call your health care provider.  You may tire easily.  You may develop headaches that can be relieved by medicines. All medicines should be approved by your health care provider.  You may urinate more often. Painful urination may mean you have a bladder infection.  You may develop heartburn as a result of your pregnancy.  You may develop constipation because certain hormones are causing the muscles that push stool through your intestines to slow down.  You may develop hemorrhoids or swollen veins (varicose veins).  Your breasts may begin to grow larger and become tender. Your nipples may stick out more, and the tissue that surrounds them (areola) may become darker.  Your gums may bleed and may be  sensitive to brushing and flossing.  Dark spots or blotches (chloasma, mask of pregnancy) may develop on your face. This will likely fade after the baby is born.  Your menstrual periods will stop.  You may have a loss of appetite.  You may develop cravings for certain kinds of food.  You may have changes in your emotions from day to day, such as being excited to be pregnant or being concerned that something may go wrong with the pregnancy and baby.  You may have more vivid and strange dreams.  You may have changes in your hair. These can include thickening of your hair, rapid growth, and changes in texture. Some women also have hair loss during or after pregnancy, or hair that feels dry or thin. Your hair will most likely return to normal after your baby is born. What to expect at prenatal visits During a routine prenatal visit:  You will be weighed to make sure you and the baby are growing normally.  Your blood pressure will be taken.  Your abdomen will be measured to track your baby's growth.  The fetal heartbeat will be listened to between weeks 10 and 14 of your pregnancy.  Test results from any previous visits will be discussed. Your health care provider may ask you:  How you are feeling.  If you are feeling the baby move.  If you have had any abnormal symptoms, such as leaking fluid, bleeding, severe headaches, or   abdominal cramping.  If you are using any tobacco products, including cigarettes, chewing tobacco, and electronic cigarettes.  If you have any questions. Other tests that may be performed during your first trimester include:  Blood tests to find your blood type and to check for the presence of any previous infections. The tests will also be used to check for low iron levels (anemia) and protein on red blood cells (Rh antibodies). Depending on your risk factors, or if you previously had diabetes during pregnancy, you may have tests to check for high blood sugar  that affects pregnant women (gestational diabetes).  Urine tests to check for infections, diabetes, or protein in the urine.  An ultrasound to confirm the proper growth and development of the baby.  Fetal screens for spinal cord problems (spina bifida) and Down syndrome.  HIV (human immunodeficiency virus) testing. Routine prenatal testing includes screening for HIV, unless you choose not to have this test.  You may need other tests to make sure you and the baby are doing well. Follow these instructions at home: Medicines  Follow your health care provider's instructions regarding medicine use. Specific medicines may be either safe or unsafe to take during pregnancy.  Take a prenatal vitamin that contains at least 600 micrograms (mcg) of folic acid.  If you develop constipation, try taking a stool softener if your health care provider approves. Eating and drinking   Eat a balanced diet that includes fresh fruits and vegetables, whole grains, good sources of protein such as meat, eggs, or tofu, and low-fat dairy. Your health care provider will help you determine the amount of weight gain that is right for you.  Avoid raw meat and uncooked cheese. These carry germs that can cause birth defects in the baby.  Eating four or five small meals rather than three large meals a day may help relieve nausea and vomiting. If you start to feel nauseous, eating a few soda crackers can be helpful. Drinking liquids between meals, instead of during meals, also seems to help ease nausea and vomiting.  Limit foods that are high in fat and processed sugars, such as fried and sweet foods.  To prevent constipation: ? Eat foods that are high in fiber, such as fresh fruits and vegetables, whole grains, and beans. ? Drink enough fluid to keep your urine clear or pale yellow. Activity  Exercise only as directed by your health care provider. Most women can continue their usual exercise routine during  pregnancy. Try to exercise for 30 minutes at least 5 days a week. Exercising will help you: ? Control your weight. ? Stay in shape. ? Be prepared for labor and delivery.  Experiencing pain or cramping in the lower abdomen or lower back is a good sign that you should stop exercising. Check with your health care provider before continuing with normal exercises.  Try to avoid standing for long periods of time. Move your legs often if you must stand in one place for a long time.  Avoid heavy lifting.  Wear low-heeled shoes and practice good posture.  You may continue to have sex unless your health care provider tells you not to. Relieving pain and discomfort  Wear a good support bra to relieve breast tenderness.  Take warm sitz baths to soothe any pain or discomfort caused by hemorrhoids. Use hemorrhoid cream if your health care provider approves.  Rest with your legs elevated if you have leg cramps or low back pain.  If you develop varicose veins   in your legs, wear support hose. Elevate your feet for 15 minutes, 3-4 times a day. Limit salt in your diet. Prenatal care  Schedule your prenatal visits by the twelfth week of pregnancy. They are usually scheduled monthly at first, then more often in the last 2 months before delivery.  Write down your questions. Take them to your prenatal visits.  Keep all your prenatal visits as told by your health care provider. This is important. Safety  Wear your seat belt at all times when driving.  Make a list of emergency phone numbers, including numbers for family, friends, the hospital, and police and fire departments. General instructions  Ask your health care provider for a referral to a local prenatal education class. Begin classes no later than the beginning of month 6 of your pregnancy.  Ask for help if you have counseling or nutritional needs during pregnancy. Your health care provider can offer advice or refer you to specialists for help  with various needs.  Do not use hot tubs, steam rooms, or saunas.  Do not douche or use tampons or scented sanitary pads.  Do not cross your legs for long periods of time.  Avoid cat litter boxes and soil used by cats. These carry germs that can cause birth defects in the baby and possibly loss of the fetus by miscarriage or stillbirth.  Avoid all smoking, herbs, alcohol, and medicines not prescribed by your health care provider. Chemicals in these products affect the formation and growth of the baby.  Do not use any products that contain nicotine or tobacco, such as cigarettes and e-cigarettes. If you need help quitting, ask your health care provider. You may receive counseling support and other resources to help you quit.  Schedule a dentist appointment. At home, brush your teeth with a soft toothbrush and be gentle when you floss. Contact a health care provider if:  You have dizziness.  You have mild pelvic cramps, pelvic pressure, or nagging pain in the abdominal area.  You have persistent nausea, vomiting, or diarrhea.  You have a bad smelling vaginal discharge.  You have pain when you urinate.  You notice increased swelling in your face, hands, legs, or ankles.  You are exposed to fifth disease or chickenpox.  You are exposed to German measles (rubella) and have never had it. Get help right away if:  You have a fever.  You are leaking fluid from your vagina.  You have spotting or bleeding from your vagina.  You have severe abdominal cramping or pain.  You have rapid weight gain or loss.  You vomit blood or material that looks like coffee grounds.  You develop a severe headache.  You have shortness of breath.  You have any kind of trauma, such as from a fall or a car accident. Summary  The first trimester of pregnancy is from week 1 until the end of week 13 (months 1 through 3).  Your body goes through many changes during pregnancy. The changes vary from  woman to woman.  You will have routine prenatal visits. During those visits, your health care provider will examine you, discuss any test results you may have, and talk with you about how you are feeling. This information is not intended to replace advice given to you by your health care provider. Make sure you discuss any questions you have with your health care provider. Document Revised: 05/06/2017 Document Reviewed: 05/05/2016 [employer] Patient Education  2020 [employer]   Second Trimester of   Pregnancy The second trimester is from week 14 through week 27 (months 4 through 6). The second trimester is often a time when you feel your best. Your body has adjusted to being pregnant, and you begin to feel better physically. Usually, morning sickness has lessened or quit completely, you may have more energy, and you may have an increase in appetite. The second trimester is also a time when the fetus is growing rapidly. At the end of the sixth month, the fetus is about 9 inches long and weighs about 1 pounds. You will likely begin to feel the baby move (quickening) between 16 and 20 weeks of pregnancy. Body changes during your second trimester Your body continues to go through many changes during your second trimester. The changes vary from woman to woman.  Your weight will continue to increase. You will notice your lower abdomen bulging out.  You may begin to get stretch marks on your hips, abdomen, and breasts.  You may develop headaches that can be relieved by medicines. The medicines should be approved by your health care provider.  You may urinate more often because the fetus is pressing on your bladder.  You may develop or continue to have heartburn as a result of your pregnancy.  You may develop constipation because certain hormones are causing the muscles that push waste through your intestines to slow down.  You may develop hemorrhoids or swollen, bulging veins (varicose  veins).  You may have back pain. This is caused by: ? Weight gain. ? Pregnancy hormones that are relaxing the joints in your pelvis. ? A shift in weight and the muscles that support your balance.  Your breasts will continue to grow and they will continue to become tender.  Your gums may bleed and may be sensitive to brushing and flossing.  Dark spots or blotches (chloasma, mask of pregnancy) may develop on your face. This will likely fade after the baby is born.  A dark line from your belly button to the pubic area (linea nigra) may appear. This will likely fade after the baby is born.  You may have changes in your hair. These can include thickening of your hair, rapid growth, and changes in texture. Some women also have hair loss during or after pregnancy, or hair that feels dry or thin. Your hair will most likely return to normal after your baby is born. What to expect at prenatal visits During a routine prenatal visit:  You will be weighed to make sure you and the fetus are growing normally.  Your blood pressure will be taken.  Your abdomen will be measured to track your baby's growth.  The fetal heartbeat will be listened to.  Any test results from the previous visit will be discussed. Your health care provider may ask you:  How you are feeling.  If you are feeling the baby move.  If you have had any abnormal symptoms, such as leaking fluid, bleeding, severe headaches, or abdominal cramping.  If you are using any tobacco products, including cigarettes, chewing tobacco, and electronic cigarettes.  If you have any questions. Other tests that may be performed during your second trimester include:  Blood tests that check for: ? Low iron levels (anemia). ? High blood sugar that affects pregnant women (gestational diabetes) between 24 and 28 weeks. ? Rh antibodies. This is to check for a protein on red blood cells (Rh factor).  Urine tests to check for infections,  diabetes, or protein in the urine.    An ultrasound to confirm the proper growth and development of the baby.  An amniocentesis to check for possible genetic problems.  Fetal screens for spina bifida and Down syndrome.  HIV (human immunodeficiency virus) testing. Routine prenatal testing includes screening for HIV, unless you choose not to have this test. Follow these instructions at home: Medicines  Follow your health care provider's instructions regarding medicine use. Specific medicines may be either safe or unsafe to take during pregnancy.  Take a prenatal vitamin that contains at least 600 micrograms (mcg) of folic acid.  If you develop constipation, try taking a stool softener if your health care provider approves. Eating and drinking   Eat a balanced diet that includes fresh fruits and vegetables, whole grains, good sources of protein such as meat, eggs, or tofu, and low-fat dairy. Your health care provider will help you determine the amount of weight gain that is right for you.  Avoid raw meat and uncooked cheese. These carry germs that can cause birth defects in the baby.  If you have low calcium intake from food, talk to your health care provider about whether you should take a daily calcium supplement.  Limit foods that are high in fat and processed sugars, such as fried and sweet foods.  To prevent constipation: ? Drink enough fluid to keep your urine clear or pale yellow. ? Eat foods that are high in fiber, such as fresh fruits and vegetables, whole grains, and beans. Activity  Exercise only as directed by your health care provider. Most women can continue their usual exercise routine during pregnancy. Try to exercise for 30 minutes at least 5 days a week. Stop exercising if you experience uterine contractions.  Avoid heavy lifting, wear low heel shoes, and practice good posture.  A sexual relationship may be continued unless your health care provider directs you  otherwise. Relieving pain and discomfort  Wear a good support bra to prevent discomfort from breast tenderness.  Take warm sitz baths to soothe any pain or discomfort caused by hemorrhoids. Use hemorrhoid cream if your health care provider approves.  Rest with your legs elevated if you have leg cramps or low back pain.  If you develop varicose veins, wear support hose. Elevate your feet for 15 minutes, 3-4 times a day. Limit salt in your diet. Prenatal Care  Write down your questions. Take them to your prenatal visits.  Keep all your prenatal visits as told by your health care provider. This is important. Safety  Wear your seat belt at all times when driving.  Make a list of emergency phone numbers, including numbers for family, friends, the hospital, and police and fire departments. General instructions  Ask your health care provider for a referral to a local prenatal education class. Begin classes no later than the beginning of month 6 of your pregnancy.  Ask for help if you have counseling or nutritional needs during pregnancy. Your health care provider can offer advice or refer you to specialists for help with various needs.  Do not use hot tubs, steam rooms, or saunas.  Do not douche or use tampons or scented sanitary pads.  Do not cross your legs for long periods of time.  Avoid cat litter boxes and soil used by cats. These carry germs that can cause birth defects in the baby and possibly loss of the fetus by miscarriage or stillbirth.  Avoid all smoking, herbs, alcohol, and unprescribed drugs. Chemicals in these products can affect the formation and growth of   the baby.  Do not use any products that contain nicotine or tobacco, such as cigarettes and e-cigarettes. If you need help quitting, ask your health care provider.  Visit your dentist if you have not gone yet during your pregnancy. Use a soft toothbrush to brush your teeth and be gentle when you floss. Contact a  health care provider if:  You have dizziness.  You have mild pelvic cramps, pelvic pressure, or nagging pain in the abdominal area.  You have persistent nausea, vomiting, or diarrhea.  You have a bad smelling vaginal discharge.  You have pain when you urinate. Get help right away if:  You have a fever.  You are leaking fluid from your vagina.  You have spotting or bleeding from your vagina.  You have severe abdominal cramping or pain.  You have rapid weight gain or weight loss.  You have shortness of breath with chest pain.  You notice sudden or extreme swelling of your face, hands, ankles, feet, or legs.  You have not felt your baby move in over an hour.  You have severe headaches that do not go away when you take medicine.  You have vision changes. Summary  The second trimester is from week 14 through week 27 (months 4 through 6). It is also a time when the fetus is growing rapidly.  Your body goes through many changes during pregnancy. The changes vary from woman to woman.  Avoid all smoking, herbs, alcohol, and unprescribed drugs. These chemicals affect the formation and growth your baby.  Do not use any tobacco products, such as cigarettes, chewing tobacco, and e-cigarettes. If you need help quitting, ask your health care provider.  Contact your health care provider if you have any questions. Keep all prenatal visits as told by your health care provider. This is important. This information is not intended to replace advice given to you by your health care provider. Make sure you discuss any questions you have with your health care provider. Document Revised: 09/15/2018 Document Reviewed: 06/29/2016 [employer] Patient Education  2020 [employer]   Contraception Choices Contraception, also called birth control, refers to methods or devices that prevent pregnancy. Hormonal methods Contraceptive implant  A contraceptive implant is a thin, plastic tube that  contains a hormone. It is inserted into the upper part of the arm. It can remain in place for up to 3 years. Progestin-only injections Progestin-only injections are injections of progestin, a synthetic form of the hormone progesterone. They are given every 3 months by a health care provider. Birth control pills  Birth control pills are pills that contain hormones that prevent pregnancy. They must be taken once a day, preferably at the same time each day. Birth control patch  The birth control patch contains hormones that prevent pregnancy. It is placed on the skin and must be changed once a week for three weeks and removed on the fourth week. A prescription is needed to use this method of contraception. Vaginal ring  A vaginal ring contains hormones that prevent pregnancy. It is placed in the vagina for three weeks and removed on the fourth week. After that, the process is repeated with a new ring. A prescription is needed to use this method of contraception. Emergency contraceptive Emergency contraceptives prevent pregnancy after unprotected sex. They come in pill form and can be taken up to 5 days after sex. They work best the sooner they are taken after having sex. Most emergency contraceptives are available without a prescription. This   method should not be used as your only form of birth control. Barrier methods Female condom  A female condom is a thin sheath that is worn over the penis during sex. Condoms keep sperm from going inside a woman's body. They can be used with a spermicide to increase their effectiveness. They should be disposed after a single use. Female condom  A female condom is a soft, loose-fitting sheath that is put into the vagina before sex. The condom keeps sperm from going inside a woman's body. They should be disposed after a single use. Diaphragm  A diaphragm is a soft, dome-shaped barrier. It is inserted into the vagina before sex, along with a spermicide. The  diaphragm blocks sperm from entering the uterus, and the spermicide kills sperm. A diaphragm should be left in the vagina for 6-8 hours after sex and removed within 24 hours. A diaphragm is prescribed and fitted by a health care provider. A diaphragm should be replaced every 1-2 years, after giving birth, after gaining more than 15 lb (6.8 kg), and after pelvic surgery. Cervical cap  A cervical cap is a round, soft latex or plastic cup that fits over the cervix. It is inserted into the vagina before sex, along with spermicide. It blocks sperm from entering the uterus. The cap should be left in place for 6-8 hours after sex and removed within 48 hours. A cervical cap must be prescribed and fitted by a health care provider. It should be replaced every 2 years. Sponge  A sponge is a soft, circular piece of polyurethane foam with spermicide on it. The sponge helps block sperm from entering the uterus, and the spermicide kills sperm. To use it, you make it wet and then insert it into the vagina. It should be inserted before sex, left in for at least 6 hours after sex, and removed and thrown away within 30 hours. Spermicides Spermicides are chemicals that kill or block sperm from entering the cervix and uterus. They can come as a cream, jelly, suppository, foam, or tablet. A spermicide should be inserted into the vagina with an applicator at least 10-15 minutes before sex to allow time for it to work. The process must be repeated every time you have sex. Spermicides do not require a prescription. Intrauterine contraception Intrauterine device (IUD) An IUD is a T-shaped device that is put in a woman's uterus. There are two types:  Hormone IUD.This type contains progestin, a synthetic form of the hormone progesterone. This type can stay in place for 3-5 years.  Copper IUD.This type is wrapped in copper wire. It can stay in place for 10 years.  Permanent methods of contraception Female tubal ligation In  this method, a woman's fallopian tubes are sealed, tied, or blocked during surgery to prevent eggs from traveling to the uterus. Hysteroscopic sterilization In this method, a small, flexible insert is placed into each fallopian tube. The inserts cause scar tissue to form in the fallopian tubes and block them, so sperm cannot reach an egg. The procedure takes about 3 months to be effective. Another form of birth control must be used during those 3 months. Female sterilization This is a procedure to tie off the tubes that carry sperm (vasectomy). After the procedure, the man can still ejaculate fluid (semen). Natural planning methods Natural family planning In this method, a couple does not have sex on days when the woman could become pregnant. Calendar method This means keeping track of the length of each menstrual cycle,   identifying the days when pregnancy can happen, and not having sex on those days. Ovulation method In this method, a couple avoids sex during ovulation. Symptothermal method This method involves not having sex during ovulation. The woman typically checks for ovulation by watching changes in her temperature and in the consistency of cervical mucus. Post-ovulation method In this method, a couple waits to have sex until after ovulation. Summary  Contraception, also called birth control, means methods or devices that prevent pregnancy.  Hormonal methods of contraception include implants, injections, pills, patches, vaginal rings, and emergency contraceptives.  Barrier methods of contraception can include female condoms, female condoms, diaphragms, cervical caps, sponges, and spermicides.  There are two types of IUDs (intrauterine devices). An IUD can be put in a woman's uterus to prevent pregnancy for 3-5 years.  Permanent sterilization can be done through a procedure for males, females, or both.  Natural family planning methods involve not having sex on days when the woman could  become pregnant. This information is not intended to replace advice given to you by your health care provider. Make sure you discuss any questions you have with your health care provider. Document Revised: 05/26/2017 Document Reviewed: 06/26/2016 Elsevier Patient Education  2020 [employer]   Breastfeeding  Choosing to breastfeed is one of the best decisions you can make for yourself and your baby. A change in hormones during pregnancy causes your breasts to make breast milk in your milk-producing glands. Hormones prevent breast milk from being released before your baby is born. They also prompt milk flow after birth. Once breastfeeding has begun, thoughts of your baby, as well as his or her sucking or crying, can stimulate the release of milk from your milk-producing glands. Benefits of breastfeeding Research shows that breastfeeding offers many health benefits for infants and mothers. It also offers a cost-free and convenient way to feed your baby. For your baby  Your first milk (colostrum) helps your baby's digestive system to function better.  Special cells in your milk (antibodies) help your baby to fight off infections.  Breastfed babies are less likely to develop asthma, allergies, obesity, or type 2 diabetes. They are also at lower risk for sudden infant death syndrome (SIDS).  Nutrients in breast milk are better able to meet your baby's needs compared to infant formula.  Breast milk improves your baby's brain development. For you  Breastfeeding helps to create a very special bond between you and your baby.  Breastfeeding is convenient. Breast milk costs nothing and is always available at the correct temperature.  Breastfeeding helps to burn calories. It helps you to lose the weight that you gained during pregnancy.  Breastfeeding makes your uterus return faster to its size before pregnancy. It also slows bleeding (lochia) after you give birth.  Breastfeeding helps to lower  your risk of developing type 2 diabetes, osteoporosis, rheumatoid arthritis, cardiovascular disease, and breast, ovarian, uterine, and endometrial cancer later in life. Breastfeeding basics Starting breastfeeding  Find a comfortable place to sit or lie down, with your neck and back well-supported.  Place a pillow or a rolled-up blanket under your baby to bring him or her to the level of your breast (if you are seated). Nursing pillows are specially designed to help support your arms and your baby while you breastfeed.  Make sure that your baby's tummy (abdomen) is facing your abdomen.  Gently massage your breast. With your fingertips, massage from the outer edges of your breast inward toward the nipple. This encourages   milk flow. If your milk flows slowly, you may need to continue this action during the feeding.  Support your breast with 4 fingers underneath and your thumb above your nipple (make the letter "C" with your hand). Make sure your fingers are well away from your nipple and your baby's mouth.  Stroke your baby's lips gently with your finger or nipple.  When your baby's mouth is open wide enough, quickly bring your baby to your breast, placing your entire nipple and as much of the areola as possible into your baby's mouth. The areola is the colored area around your nipple. ? More areola should be visible above your baby's upper lip than below the lower lip. ? Your baby's lips should be opened and extended outward (flanged) to ensure an adequate, comfortable latch. ? Your baby's tongue should be between his or her lower gum and your breast.  Make sure that your baby's mouth is correctly positioned around your nipple (latched). Your baby's lips should create a seal on your breast and be turned out (everted).  It is common for your baby to suck about 2-3 minutes in order to start the flow of breast milk. Latching Teaching your baby how to latch onto your breast properly is very  important. An improper latch can cause nipple pain, decreased milk supply, and poor weight gain in your baby. Also, if your baby is not latched onto your nipple properly, he or she may swallow some air during feeding. This can make your baby fussy. Burping your baby when you switch breasts during the feeding can help to get rid of the air. However, teaching your baby to latch on properly is still the best way to prevent fussiness from swallowing air while breastfeeding. Signs that your baby has successfully latched onto your nipple  Silent tugging or silent sucking, without causing you pain. Infant's lips should be extended outward (flanged).  Swallowing heard between every 3-4 sucks once your milk has started to flow (after your let-down milk reflex occurs).  Muscle movement above and in front of his or her ears while sucking. Signs that your baby has not successfully latched onto your nipple  Sucking sounds or smacking sounds from your baby while breastfeeding.  Nipple pain. If you think your baby has not latched on correctly, slip your finger into the corner of your baby's mouth to break the suction and place it between your baby's gums. Attempt to start breastfeeding again. Signs of successful breastfeeding Signs from your baby  Your baby will gradually decrease the number of sucks or will completely stop sucking.  Your baby will fall asleep.  Your baby's body will relax.  Your baby will retain a small amount of milk in his or her mouth.  Your baby will let go of your breast by himself or herself. Signs from you  Breasts that have increased in firmness, weight, and size 1-3 hours after feeding.  Breasts that are softer immediately after breastfeeding.  Increased milk volume, as well as a change in milk consistency and color by the fifth day of breastfeeding.  Nipples that are not sore, cracked, or bleeding. Signs that your baby is getting enough milk  Wetting at least 1-2  diapers during the first 24 hours after birth.  Wetting at least 5-6 diapers every 24 hours for the first week after birth. The urine should be clear or pale yellow by the age of 5 days.  Wetting 6-8 diapers every 24 hours as your baby continues   to grow and develop.  At least 3 stools in a 24-hour period by the age of 5 days. The stool should be soft and yellow.  At least 3 stools in a 24-hour period by the age of 7 days. The stool should be seedy and yellow.  No loss of weight greater than 10% of birth weight during the first 3 days of life.  Average weight gain of 4-7 oz (113-198 g) per week after the age of 4 days.  Consistent daily weight gain by the age of 5 days, without weight loss after the age of 2 weeks. After a feeding, your baby may spit up a small amount of milk. This is normal. Breastfeeding frequency and duration Frequent feeding will help you make more milk and can prevent sore nipples and extremely full breasts (breast engorgement). Breastfeed when you feel the need to reduce the fullness of your breasts or when your baby shows signs of hunger. This is called "breastfeeding on demand." Signs that your baby is hungry include:  Increased alertness, activity, or restlessness.  Movement of the head from side to side.  Opening of the mouth when the corner of the mouth or cheek is stroked (rooting).  Increased sucking sounds, smacking lips, cooing, sighing, or squeaking.  Hand-to-mouth movements and sucking on fingers or hands.  Fussing or crying. Avoid introducing a pacifier to your baby in the first 4-6 weeks after your baby is born. After this time, you may choose to use a pacifier. Research has shown that pacifier use during the first year of a baby's life decreases the risk of sudden infant death syndrome (SIDS). Allow your baby to feed on each breast as long as he or she wants. When your baby unlatches or falls asleep while feeding from the first breast, offer the  second breast. Because newborns are often sleepy in the first few weeks of life, you may need to awaken your baby to get him or her to feed. Breastfeeding times will vary from baby to baby. However, the following rules can serve as a guide to help you make sure that your baby is properly fed:  Newborns (babies 4 weeks of age or younger) may breastfeed every 1-3 hours.  Newborns should not go without breastfeeding for longer than 3 hours during the day or 5 hours during the night.  You should breastfeed your baby a minimum of 8 times in a 24-hour period. Breast milk pumping     Pumping and storing breast milk allows you to make sure that your baby is exclusively fed your breast milk, even at times when you are unable to breastfeed. This is especially important if you go back to work while you are still breastfeeding, or if you are not able to be present during feedings. Your lactation consultant can help you find a method of pumping that works best for you and give you guidelines about how long it is safe to store breast milk. Caring for your breasts while you breastfeed Nipples can become dry, cracked, and sore while breastfeeding. The following recommendations can help keep your breasts moisturized and healthy:  Avoid using soap on your nipples.  Wear a supportive bra designed especially for nursing. Avoid wearing underwire-style bras or extremely tight bras (sports bras).  Air-dry your nipples for 3-4 minutes after each feeding.  Use only cotton bra pads to absorb leaked breast milk. Leaking of breast milk between feedings is normal.  Use lanolin on your nipples after breastfeeding. Lanolin helps   to maintain your skin's normal moisture barrier. Pure lanolin is not harmful (not toxic) to your baby. You may also hand express a few drops of breast milk and gently massage that milk into your nipples and allow the milk to air-dry. In the first few weeks after giving birth, some women experience  breast engorgement. Engorgement can make your breasts feel heavy, warm, and tender to the touch. Engorgement peaks within 3-5 days after you give birth. The following recommendations can help to ease engorgement:  Completely empty your breasts while breastfeeding or pumping. You may want to start by applying warm, moist heat (in the shower or with warm, water-soaked hand towels) just before feeding or pumping. This increases circulation and helps the milk flow. If your baby does not completely empty your breasts while breastfeeding, pump any extra milk after he or she is finished.  Apply ice packs to your breasts immediately after breastfeeding or pumping, unless this is too uncomfortable for you. To do this: ? Put ice in a plastic bag. ? Place a towel between your skin and the bag. ? Leave the ice on for 20 minutes, 2-3 times a day.  Make sure that your baby is latched on and positioned properly while breastfeeding. If engorgement persists after 48 hours of following these recommendations, contact your health care provider or a lactation consultant. Overall health care recommendations while breastfeeding  Eat 3 healthy meals and 3 snacks every day. Well-nourished mothers who are breastfeeding need an additional 450-500 calories a day. You can meet this requirement by increasing the amount of a balanced diet that you eat.  Drink enough water to keep your urine pale yellow or clear.  Rest often, relax, and continue to take your prenatal vitamins to prevent fatigue, stress, and low vitamin and mineral levels in your body (nutrient deficiencies).  Do not use any products that contain nicotine or tobacco, such as cigarettes and e-cigarettes. Your baby may be harmed by chemicals from cigarettes that pass into breast milk and exposure to secondhand smoke. If you need help quitting, ask your health care provider.  Avoid alcohol.  Do not use illegal drugs or marijuana.  Talk with your health care  provider before taking any medicines. These include over-the-counter and prescription medicines as well as vitamins and herbal supplements. Some medicines that may be harmful to your baby can pass through breast milk.  It is possible to become pregnant while breastfeeding. If birth control is desired, ask your health care provider about options that will be safe while breastfeeding your baby. Where to find more information: [employer]: www.llli.org Contact a health care provider if:  You feel like you want to stop breastfeeding or have become frustrated with breastfeeding.  Your nipples are cracked or bleeding.  Your breasts are red, tender, or warm.  You have: ? Painful breasts or nipples. ? A swollen area on either breast. ? A fever or chills. ? Nausea or vomiting. ? Drainage other than breast milk from your nipples.  Your breasts do not become full before feedings by the fifth day after you give birth.  You feel sad and depressed.  Your baby is: ? Too sleepy to eat well. ? Having trouble sleeping. ? More than 1 week old and wetting fewer than 6 diapers in a 24-hour period. ? Not gaining weight by 5 days of age.  Your baby has fewer than 3 stools in a 24-hour period.  Your baby's skin or the white parts of   his or her eyes become yellow. Get help right away if:  Your baby is overly tired (lethargic) and does not want to wake up and feed.  Your baby develops an unexplained fever. Summary  Breastfeeding offers many health benefits for infant and mothers.  Try to breastfeed your infant when he or she shows early signs of hunger.  Gently tickle or stroke your baby's lips with your finger or nipple to allow the baby to open his or her mouth. Bring the baby to your breast. Make sure that much of the areola is in your baby's mouth. Offer one side and burp the baby before you offer the other side.  Talk with your health care provider or lactation consultant  if you have questions or you face problems as you breastfeed. This information is not intended to replace advice given to you by your health care provider. Make sure you discuss any questions you have with your health care provider. Document Revised: 08/18/2017 Document Reviewed: 06/25/2016 Elsevier Patient Education  2020 [employer] Inc.  

## 2020-04-28 NOTE — Discharge Instructions (Signed)
Instructions were given for threatened miscarriage ONLY because you had vaginal bleeding in early pregnancy. There was no indication from tonight's exams that show you are having a miscarriage. These instructions are for INFORMATION ONLY!  NO SEX UNTIL BLEEDING HAS STOPPED!! LIMIT EXERCISE TO WALKING ONLY UNTIL YOU ARE CLEARED BY YOUR DOCTOR.  Return to MAU:  If you have heavier bleeding that soaks through more that 2 pads per hour for an hour or more  If you bleed so much that you feel like you might pass out or you do pass out  If you have significant abdominal pain that is not improved with Tylenol 1000 mg every 6 hours as needed for pain  If you develop a fever > 100.5

## 2020-04-28 NOTE — Progress Notes (Signed)
   Subjective:    Rachael Baker is a [id number] [redacted]w[redacted]d being seen today for her first obstetrical visit.  Her obstetrical history is significant for advanced maternal age. Patient does intend to breast feed. Pregnancy history fully reviewed.  Patient reports no complaints.  Vitals:   04/28/20 1458  BP: 139/82  Pulse: (!) 108  Weight: 169 lb 4.8 oz (76.8 kg)    HISTORY: OB History  Gravida Para Term Preterm AB Living  4 3 3     3   SAB TAB Ectopic Multiple Live Births          3    # Outcome Date GA Lbr Len/2nd Weight Sex Delivery Anes PTL Lv  4 Current           3 Term 04/08/10    M Vag-Spont   LIV  2 Term 08/31/06    F Vag-Spont   LIV  1 Term 02/20/03    02/22/03   Past Medical History:  Diagnosis Date  . Asthma    Past Surgical History:  Procedure Laterality Date  . NO PAST SURGERIES     Family History  Problem Relation Age of Onset  . Cancer Mother   . Hypertension Mother   . Hypertension Paternal Grandmother   . Diabetes Paternal Grandmother   . Diabetes Paternal Aunt   . Asthma Maternal Grandmother   . Hypertension Maternal Grandmother      Exam    Uterus:     Pelvic Exam:    Perineum: No Hemorrhoids, Normal Perineum   Vulva: normal   Vagina:  normal mucosa, normal discharge   pH:    Cervix: multiparous appearance and cervix is closed and long   Adnexa: normal adnexa   Bony Pelvis: gynecoid  System: Breast:  normal appearance, no masses or tenderness   Skin: normal coloration and turgor, no rashes    Neurologic: oriented, no focal deficits   Extremities: normal strength, tone, and muscle mass   HEENT extra ocular movement intact   Mouth/Teeth mucous membranes moist, pharynx normal without lesions and dental hygiene good   Neck supple and no masses   Cardiovascular: regular rate and rhythm   Respiratory:  appears well, vitals normal, no respiratory distress, acyanotic, normal RR, chest clear, no wheezing, crepitations, rhonchi, normal  symmetric air entry   Abdomen: soft, non-tender; bowel sounds normal; no masses,  no organomegaly   Urinary:       Assessment:    Pregnancy: Rachael Baker Patient Active Problem List   Diagnosis Date Noted  . Advanced maternal age in multigravida 04/28/2020  . Supervision of other normal pregnancy, antepartum 03/18/2020        Plan:     Initial labs drawn. Prenatal vitamins. Problem list reviewed and updated. Genetic Screening discussed : panorama ordered.  Ultrasound discussed; fetal survey: requested.  Follow up in 4 weeks. 50% of 30 min visit spent on counseling and coordination of care.     Kaaren Nass 04/28/2020

## 2020-04-28 NOTE — ED Provider Notes (Signed)
This is a 39 year old female G4, P3 who is [redacted] weeks pregnant presenting for evaluation of vaginal bleeding that started today.  At this time she is stable and can be transferred over to MAU for further work-up.  Ht 5\' 6"  (1.676 m)   Wt 82 kg   LMP 02/06/2020   BMI 29.18 kg/m     04/07/2020, Fayrene Helper 04/28/20 2016    2017, MD 04/28/20 [phone]

## 2020-04-28 NOTE — MAU Provider Note (Signed)
History     CSN: [id number]  Arrival date and time: 04/28/20 1851   First Provider Initiated Contact with Patient 04/28/20 2101      Chief Complaint  Patient presents with  . [redacted] Weeks Pregnant/Vaginal Bleeding   Rachael Baker is a 39 y.o. year old G52P3003 female at [redacted]w[redacted]d weeks gestation who was transferred to MAU from [facility] for vaginal bleeding after riding on stationery bike at home around 1800 this evening. She reports she was only on the bike for 20 minutes and did not ride at a vigorous pace. "I'm afraid I might have done too much, but my doctor told me today I could exercise." She states the bleeding started out as dark red and was not enough to soak a pad. She reports she is now only spotting. Her last SI was 04/26/2020. She denies pain at this time. She has established [facility] with [facility]; her NOB appt was today. She denies any pelvic exam performed today. She denies any complications in any of her other pregnancies; h/o 3 FT SVD.   OB History    Gravida  4   Para  3   Term  3   Preterm      AB      Living  3     SAB      TAB      Ectopic      Multiple      Live Births  3           Past Medical History:  Diagnosis Date  . Asthma     Past Surgical History:  Procedure Laterality Date  . NO PAST SURGERIES      Family History  Problem Relation Age of Onset  . Cancer Mother   . Hypertension Mother   . Hypertension Paternal Grandmother   . Diabetes Paternal Grandmother   . Diabetes Paternal Aunt   . Asthma Maternal Grandmother   . Hypertension Maternal Grandmother     Social History   Tobacco Use  . Smoking status: Never Smoker  . Smokeless tobacco: Never Used  Vaping Use  . Vaping Use: Never used  Substance Use Topics  . Alcohol use: No  . Drug use: No    Allergies: No Known Allergies  Medications Prior to Admission  Medication Sig Dispense Refill Last Dose  . Prenatal Vit-Fe Fumarate-FA (MULTIVITAMIN-PRENATAL) 27-0.8 MG TABS  tablet Take 1 tablet by mouth daily at 12 noon.   04/28/2020 at Unknown time  . ibuprofen (ADVIL,MOTRIN) 200 MG tablet Take 600 mg by mouth every 6 (six) hours as needed for pain.     . Vitamin D, Ergocalciferol, (DRISDOL) 50000 units CAPS capsule Take 50,000 Units by mouth every 7 (seven) days.       Review of Systems  Constitutional: Negative.   HENT: Negative.   Eyes: Negative.   Respiratory: Negative.   Cardiovascular: Negative.   Gastrointestinal: Negative.   Endocrine: Negative.   Genitourinary: Positive for vaginal bleeding.  Musculoskeletal: Negative.   Skin: Negative.   Allergic/Immunologic: Negative.   Neurological: Negative.   Hematological: Negative.   Psychiatric/Behavioral: Negative.    Physical Exam   Blood pressure 134/84, pulse (!) 103, temperature 98.5 F (36.9 C), temperature source Oral, resp. rate 16, height 5\' 7"  (1.702 m), weight 77.7 kg, last menstrual period 02/06/2020, SpO2 100 %.  Physical Exam Vitals and nursing note reviewed. Exam conducted with a chaperone present.  Constitutional:      Appearance: Normal appearance.  She is normal weight.  HENT:     Head: Normocephalic and atraumatic.  Eyes:     Pupils: Pupils are equal, round, and reactive to light.  Cardiovascular:     Rate and Rhythm: Normal rate.     Pulses: Normal pulses.  Pulmonary:     Effort: Pulmonary effort is normal.  Abdominal:     General: Abdomen is flat.     Palpations: Abdomen is soft.  Genitourinary:    General: Normal vulva.     Comments: Uterus: non-tender, S=D, SE: cervix is smooth, pink, no lesions, scant amt of mucoid, red blood in vaginal vault -- WP, GC/CT done, closed/long/firm, no CMT or friability, no adnexal tenderness  Musculoskeletal:        General: Normal range of motion.     Cervical back: Normal range of motion.  Skin:    General: Skin is warm and dry.  Neurological:     Mental Status: She is alert and oriented to person, place, and time.  Psychiatric:         Mood and Affect: Mood normal.        Behavior: Behavior normal.        Thought Content: Thought content normal.        Judgment: Judgment normal.    FHTs by doppler: 154 bpm  MAU Course  Procedures  MDM CCUA CBC ABO/Rh Wet Prep GC/CT -- pending HIV -- pending OB < 14 wks [place] with TV  Results for orders placed or performed during the hospital encounter of 04/28/20 (from the past 24 hour(s))  CBC     Status: Abnormal   Collection Time: 04/28/20  9:04 PM  Result Value Ref Range   WBC 12.5 (H) 4.0 - 10.5 K/uL   RBC 3.92 3.87 - 5.11 MIL/uL   Hemoglobin 12.4 12.0 - 15.0 g/dL   HCT 48.5 36 - 46 %   MCV 94.1 80.0 - 100.0 fL   MCH 31.6 26.0 - 34.0 pg   MCHC 33.6 30.0 - 36.0 g/dL   RDW 46.2 70.3 - 50.0 %   Platelets 232 150 - 400 K/uL   nRBC 0.0 0.0 - 0.2 %  ABO/Rh     Status: None   Collection Time: 04/28/20  9:04 PM  Result Value Ref Range   ABO/RH(D) B POS    No rh immune globuloin      NOT A RH IMMUNE GLOBULIN CANDIDATE, PT RH POSITIVE Performed at [facility], [address] [place], [place] [address]   Wet prep, genital     Status: Abnormal   Collection Time: 04/28/20  9:12 PM   Specimen: PATH Cytology Cervicovaginal Ancillary Only  Result Value Ref Range   Yeast Wet Prep HPF POC NONE SEEN NONE SEEN   Trich, Wet Prep NONE SEEN NONE SEEN   Clue Cells Wet Prep HPF POC NONE SEEN NONE SEEN   WBC, Wet Prep HPF POC FEW (A) NONE SEEN   Sperm NONE SEEN     US OB Comp Less 14 Wks  Result Date: 04/28/2020 CLINICAL DATA:  39 year old pregnant female with vaginal bleeding. LMP: 02/06/2020 corresponding to an estimated gestational age of [redacted] weeks, 5 days. EXAM: OBSTETRIC <14 WK ULTRASOUND TECHNIQUE: Transabdominal ultrasound was performed for evaluation of the gestation as well as the maternal uterus and adnexal regions. COMPARISON:  None. FINDINGS: Intrauterine gestational sac: Single intrauterine gestational sac. Yolk sac:  Not seen Embryo:  Present Cardiac  Activity: Detected Heart Rate: 152 bpm  CRL: 60 mm   12 w 3 d                  [place] EDC: 11/07/2020 Subchorionic hemorrhage:  None visualized. Maternal uterus/adnexae: The maternal ovaries are unremarkable. There is a 2.0 x 1.7 x 1.6 cm left anterior body submucosal fibroid and a 2.2 x 1.6 x 1.9 cm right body intramural fibroid. IMPRESSION: Single live intrauterine pregnancy with an estimated gestational age of [redacted] weeks, 3 days. Electronically Signed   By: Elgie Collard M.D.   On: 04/28/2020 21:38    Assessment and Plan  Vaginal bleeding in pregnancy, first trimester  - Information provided on threatened miscarriage - Advised to refrain from SI until bleeding has stopped - Instructed when to return to MAU:  If you have heavier bleeding that soaks through more that 2 pads per hour for an hour or more  If you bleed so much that you feel like you might pass out or you do pass out  If you have significant abdominal pain that is not improved with Tylenol 1000 mg every 6 hours as needed for pain  If you develop a fever > 100.5 - Discharge home - Keep scheduled appt at [facility] on 05/26/2020 - Patient verbalized an understanding of the plan of care and agrees.       Raelyn Mora , MSN, CNM 04/28/2020, 9:01 PM

## 2020-04-29 LAB — OBSTETRIC PANEL, INCLUDING HIV
Antibody Screen: NEGATIVE
Basophils Absolute: 0.1 10*3/uL (ref 0.0–0.2)
Basos: 1 %
EOS (ABSOLUTE): 0.3 10*3/uL (ref 0.0–0.4)
Eos: 3 %
HIV Screen 4th Generation wRfx: NONREACTIVE
Hematocrit: 37.5 % (ref 34.0–46.6)
Hemoglobin: 12.7 g/dL (ref 11.1–15.9)
Hepatitis B Surface Ag: NEGATIVE
Immature Grans (Abs): 0 10*3/uL (ref 0.0–0.1)
Immature Granulocytes: 0 %
Lymphocytes Absolute: 3.2 10*3/uL — ABNORMAL HIGH (ref 0.7–3.1)
Lymphs: 28 %
MCH: 31.8 pg (ref 26.6–33.0)
MCHC: 33.9 g/dL (ref 31.5–35.7)
MCV: 94 fL (ref 79–97)
Monocytes Absolute: 1 10*3/uL — ABNORMAL HIGH (ref 0.1–0.9)
Monocytes: 8 %
Neutrophils Absolute: 7.1 10*3/uL — ABNORMAL HIGH (ref 1.4–7.0)
Neutrophils: 60 %
Platelets: 230 10*3/uL (ref 150–450)
RBC: 3.99 x10E6/uL (ref 3.77–5.28)
RDW: 12.3 % (ref 11.7–15.4)
RPR Ser Ql: NONREACTIVE
Rh Factor: POSITIVE
Rubella Antibodies, IGG: 1.57 index (ref >0.99)
WBC: 11.7 10*3/uL — ABNORMAL HIGH (ref 3.4–10.8)

## 2020-04-29 LAB — CERVICOVAGINAL ANCILLARY ONLY
Chlamydia: NEGATIVE
Comment: NEGATIVE
Comment: NORMAL
Neisseria Gonorrhea: NEGATIVE

## 2020-04-29 LAB — HEPATITIS C ANTIBODY: Hep C Virus Ab: <0.1 s/co ratio (ref 0.0–0.9)

## 2020-04-29 LAB — GC/CHLAMYDIA PROBE AMP (~~LOC~~) NOT AT ARMC
Chlamydia: NEGATIVE
Comment: NEGATIVE
Comment: NORMAL
Neisseria Gonorrhea: NEGATIVE

## 2020-05-02 LAB — CULTURE, OB URINE

## 2020-05-02 LAB — URINE CULTURE, OB REFLEX

## 2020-05-02 LAB — OB RESULTS CONSOLE GBS: GBS: POSITIVE

## 2020-05-03 ENCOUNTER — Encounter: Payer: Self-pay | Admitting: Obstetrics & Gynecology

## 2020-05-03 DIAGNOSIS — R8271 Bacteriuria: Secondary | ICD-10-CM | POA: Insufficient documentation

## 2020-05-05 ENCOUNTER — Encounter: Payer: Self-pay | Admitting: Obstetrics and Gynecology

## 2020-05-16 ENCOUNTER — Encounter: Payer: Self-pay | Admitting: Obstetrics and Gynecology

## 2020-05-26 ENCOUNTER — Encounter: Payer: Managed Care, Other (non HMO) | Admitting: Obstetrics and Gynecology

## 2020-06-02 ENCOUNTER — Other Ambulatory Visit: Payer: Self-pay

## 2020-06-02 ENCOUNTER — Encounter: Payer: Self-pay | Admitting: Obstetrics and Gynecology

## 2020-06-02 ENCOUNTER — Ambulatory Visit (INDEPENDENT_AMBULATORY_CARE_PROVIDER_SITE_OTHER): Payer: Managed Care, Other (non HMO) | Admitting: Obstetrics and Gynecology

## 2020-06-02 VITALS — BP 132/69 | HR 105 | Wt 173.0 lb

## 2020-06-02 DIAGNOSIS — O09529 Supervision of elderly multigravida, unspecified trimester: Secondary | ICD-10-CM

## 2020-06-02 DIAGNOSIS — R8271 Bacteriuria: Secondary | ICD-10-CM

## 2020-06-02 NOTE — Progress Notes (Signed)
Patient presents for ROB. Patient has no concerns. Patient has no concerns today.

## 2020-06-02 NOTE — Progress Notes (Signed)
   PRENATAL VISIT NOTE  Subjective:  Rachael Baker is a 39 y.o. G4P3003 at [redacted]w[redacted]d being seen today for ongoing prenatal care.  She is currently monitored for the following issues for this low-risk pregnancy and has Encounter for supervision of high-risk pregnancy with multigravida of advanced maternal age and Group B streptococcal bacteriuria on their problem list.  Patient reports no complaints.  Contractions: Not present. Vag. Bleeding: None.  Movement: Present. Denies leaking of fluid.   The following portions of the patient's history were reviewed and updated as appropriate: allergies, current medications, past family history, past medical history, past social history, past surgical history and problem list.   Objective:   Vitals:   06/02/20 1609  BP: 132/69  Pulse: (!) 105  Weight: 173 lb (78.5 kg)    Fetal Status: Fetal Heart Rate (bpm): 145   Movement: Present     General:  Alert, oriented and cooperative. Patient is in no acute distress.  Skin: Skin is warm and dry. No rash noted.   Cardiovascular: Normal heart rate noted  Respiratory: Normal respiratory effort, no problems with respiration noted  Abdomen: Soft, gravid, appropriate for gestational age.  Pain/Pressure: Absent     Pelvic: Cervical exam deferred        Extremities: Normal range of motion.  Edema: None  Mental Status: Normal mood and affect. Normal behavior. Normal judgment and thought content.   Assessment and Plan:  Pregnancy: G4P3003 at [redacted]w[redacted]d 1. Encounter for supervision of high-risk pregnancy with multigravida of advanced maternal age Patient is doing well AFP today Anatomy ultrasound ordered  2. Group B streptococcal bacteriuria Prophylaxis in labor  Preterm labor symptoms and general obstetric precautions including but not limited to vaginal bleeding, contractions, leaking of fluid and fetal movement were reviewed in detail with the patient. Please refer to After Visit Summary for other counseling  recommendations.   Return in about 4 weeks (around 06/30/2020) for in person, ROB, Low risk.  Future Appointments  Date Time Provider Department Center  06/30/2020  3:40 PM Leftwich-Kirby, Wilmer Floor, CNM [facility] None    Catalina Antigua, MD

## 2020-06-04 LAB — AFP, SERUM, OPEN SPINA BIFIDA
AFP MoM: 0.74
AFP Value: 26.7 ng/mL
Gest. Age on Collection Date: 16.5 weeks
Maternal Age At EDD: 38.3 yr
OSBR Risk 1 IN: 10000
Test Results:: NEGATIVE
Weight: 173 [lb_av]

## 2020-06-07 NOTE — L&D Delivery Note (Addendum)
OB/GYN Faculty Practice Delivery Note  Rachael Baker is a 40 y.o. L4Y5035 s/p SVD at [redacted]w[redacted]d. She was admitted for PROM  ROM: 10pm on 6/4 with clear fluid GBS Status:  Positive/-- (11/26 0000) Maximum Maternal Temperature: 98.6 F  Labor Progress: . Patient presented to L&D for PROM. Initial SVE: 2/50/-3. Labor course uncomplicated. She received one dose of cytotec then progressed to complete.   Delivery Date/Time: 11/09/20 0608 Delivery: Called to room and patient was complete and pushing. Head position was OA. Some difficulty delivering baby with downward traction, a compound hand was found and delivered. Also delivered through a loose nuchal cord. Infant with spontaneous cry, placed on mother's abdomen, dried and stimulated. Cord clamped x 2 after 1-minute delay, and cut by FOB. Cord blood drawn. Placenta delivered spontaneously with gentle cord traction. Fundus firm with massage and pitocin started. Labia, perineum, vagina, and cervix inspected, no lacerations.   Baby Weight: 3175g  Cord: central insertion, 3 vessel Placenta: Sent to L&D Complications: None Lacerations: none EBL: 100cc Analgesia: Epidural   Infant: APGAR (1 MIN): 9   APGAR (5 MINS): 9    Will Weisgerber MD, PGY-1 OBGYN Faculty Teaching Service  11/09/2020, 6:29 AM  I was gloved and present for entirety of delivery.  I agree with the findings and the plan of care as documented in the resident's note.  Casper Harrison, MD [facility] Family Medicine Fellow, [facility] for [facility], [facility]

## 2020-06-25 ENCOUNTER — Ambulatory Visit: Payer: Managed Care, Other (non HMO) | Attending: Obstetrics and Gynecology

## 2020-06-25 ENCOUNTER — Other Ambulatory Visit: Payer: Self-pay

## 2020-06-25 ENCOUNTER — Ambulatory Visit: Payer: Managed Care, Other (non HMO) | Admitting: *Deleted

## 2020-06-25 ENCOUNTER — Encounter: Payer: Self-pay | Admitting: *Deleted

## 2020-06-25 DIAGNOSIS — O09529 Supervision of elderly multigravida, unspecified trimester: Secondary | ICD-10-CM | POA: Insufficient documentation

## 2020-06-26 ENCOUNTER — Other Ambulatory Visit: Payer: Self-pay | Admitting: *Deleted

## 2020-06-26 DIAGNOSIS — Z148 Genetic carrier of other disease: Secondary | ICD-10-CM

## 2020-06-30 ENCOUNTER — Other Ambulatory Visit: Payer: Self-pay

## 2020-06-30 ENCOUNTER — Ambulatory Visit (INDEPENDENT_AMBULATORY_CARE_PROVIDER_SITE_OTHER): Payer: Managed Care, Other (non HMO) | Admitting: Advanced Practice Midwife

## 2020-06-30 ENCOUNTER — Encounter: Payer: Self-pay | Admitting: Advanced Practice Midwife

## 2020-06-30 VITALS — BP 134/76 | HR 112 | Wt 175.6 lb

## 2020-06-30 DIAGNOSIS — Z3A20 20 weeks gestation of pregnancy: Secondary | ICD-10-CM

## 2020-06-30 DIAGNOSIS — O09521 Supervision of elderly multigravida, first trimester: Secondary | ICD-10-CM

## 2020-06-30 DIAGNOSIS — R8271 Bacteriuria: Secondary | ICD-10-CM

## 2020-06-30 DIAGNOSIS — O09529 Supervision of elderly multigravida, unspecified trimester: Secondary | ICD-10-CM

## 2020-06-30 NOTE — Progress Notes (Signed)
   PRENATAL VISIT NOTE  Subjective:  Rachael Baker is a 40 y.o. G4P3003 at [redacted]w[redacted]d being seen today for ongoing prenatal care.  She is currently monitored for the following issues for this high-risk pregnancy and has Encounter for supervision of high-risk pregnancy with multigravida of advanced maternal age and Group B streptococcal bacteriuria on their problem list.  Patient reports no complaints.  Contractions: Not present. Vag. Bleeding: None.  Movement: Present. Denies leaking of fluid.   The following portions of the patient's history were reviewed and updated as appropriate: allergies, current medications, past family history, past medical history, past social history, past surgical history and problem list.   Objective:   Vitals:   06/30/20 1601  BP: 134/76  Pulse: (!) 112  Weight: 175 lb 9.6 oz (79.7 kg)    Fetal Status: Fetal Heart Rate (bpm): 156   Movement: Present     General:  Alert, oriented and cooperative. Patient is in no acute distress.  Skin: Skin is warm and dry. No rash noted.   Cardiovascular: Normal heart rate noted  Respiratory: Normal respiratory effort, no problems with respiration noted  Abdomen: Soft, gravid, appropriate for gestational age.  Pain/Pressure: Present     Pelvic: Cervical exam deferred        Extremities: Normal range of motion.  Edema: None  Mental Status: Normal mood and affect. Normal behavior. Normal judgment and thought content.   Assessment and Plan:  Pregnancy: G4P3003 at [redacted]w[redacted]d 1. Encounter for supervision of high-risk pregnancy with multigravida of advanced maternal age --Anticipatory guidance about next visits/weeks of pregnancy given. --Next visit in 4 weeks, virtual, then 8 weeks in office for GTT  2. Group B streptococcal bacteriuria --Needs prophylaxis during labor  3. Multigravida of advanced maternal age in first trimester   4. [redacted] weeks gestation of pregnancy   Preterm labor symptoms and general obstetric  precautions including but not limited to vaginal bleeding, contractions, leaking of fluid and fetal movement were reviewed in detail with the patient. Please refer to After Visit Summary for other counseling recommendations.   No follow-ups on file.  Future Appointments  Date Time Provider Department Center  08/01/2020  9:45 AM [facility] US5 [facility]    Sharen Counter, CNM

## 2020-06-30 NOTE — Patient Instructions (Signed)

## 2020-06-30 NOTE — Progress Notes (Signed)
ROB 20w    CC: None

## 2020-07-28 ENCOUNTER — Encounter: Payer: Self-pay | Admitting: Obstetrics and Gynecology

## 2020-07-28 ENCOUNTER — Telehealth (INDEPENDENT_AMBULATORY_CARE_PROVIDER_SITE_OTHER): Payer: Managed Care, Other (non HMO) | Admitting: Obstetrics and Gynecology

## 2020-07-28 DIAGNOSIS — O99820 Streptococcus B carrier state complicating pregnancy: Secondary | ICD-10-CM

## 2020-07-28 DIAGNOSIS — Z3A24 24 weeks gestation of pregnancy: Secondary | ICD-10-CM

## 2020-07-28 DIAGNOSIS — R8271 Bacteriuria: Secondary | ICD-10-CM

## 2020-07-28 DIAGNOSIS — O09522 Supervision of elderly multigravida, second trimester: Secondary | ICD-10-CM

## 2020-07-28 DIAGNOSIS — O09529 Supervision of elderly multigravida, unspecified trimester: Secondary | ICD-10-CM

## 2020-07-28 MED ORDER — ASPIRIN EC 81 MG PO TBEC: 81.0000 mg | DELAYED_RELEASE_TABLET | Freq: Every day | ORAL | 2 refills | Status: DC

## 2020-07-28 NOTE — Progress Notes (Signed)
Virtual ROB   CC: Notes lower pelvic pain when laying down.

## 2020-07-28 NOTE — Progress Notes (Signed)
   OBSTETRICS PRENATAL VIRTUAL VISIT ENCOUNTER NOTE  Provider location: Center for [facility] [place]   I connected with [name] on 07/28/20 at  4:00 PM EST by MyChart Video Encounter at home and verified that I am speaking with the correct person using two identifiers.   I discussed the limitations, risks, security and privacy concerns of performing an evaluation and management service virtually and the availability of in person appointments. I also discussed with the patient that there may be a patient responsible charge related to this service. The patient expressed understanding and agreed to proceed. Subjective:  Rachael Baker is a 40 y.o. G4P3003 at [redacted]w[redacted]d being seen today for ongoing prenatal care.  She is currently monitored for the following issues for this high-risk pregnancy and has Encounter for supervision of high-risk pregnancy with multigravida of advanced maternal age and Group B streptococcal bacteriuria on their problem list.  Patient reports no complaints.  Contractions: Not present. Vag. Bleeding: None.  Movement: Present. Denies any leaking of fluid.   The following portions of the patient's history were reviewed and updated as appropriate: allergies, current medications, past family history, past medical history, past social history, past surgical history and problem list.   Objective:  There were no vitals filed for this visit.  Fetal Status:     Movement: Present     General:  Alert, oriented and cooperative. Patient is in no acute distress.  Respiratory: Normal respiratory effort, no problems with respiration noted  Mental Status: Normal mood and affect. Normal behavior. Normal judgment and thought content.  Rest of physical exam deferred due to type of encounter  Imaging: No results found.  Assessment and Plan:  Pregnancy: G4P3003 at [redacted]w[redacted]d 1. Encounter for supervision of high-risk pregnancy with multigravida of advanced maternal  age Patient is doing well  Third trimester labs with glucola next visit Rx ASA provided Patient planning on getting covid booster  2. Group B streptococcal bacteriuria Prophylaxis in labor  Preterm labor symptoms and general obstetric precautions including but not limited to vaginal bleeding, contractions, leaking of fluid and fetal movement were reviewed in detail with the patient. I discussed the assessment and treatment plan with the patient. The patient was provided an opportunity to ask questions and all were answered. The patient agreed with the plan and demonstrated an understanding of the instructions. The patient was advised to call back or seek an in-person office evaluation/go to MAU at [facility] for any urgent or concerning symptoms. Please refer to After Visit Summary for other counseling recommendations.   I provided 15 minutes of face-to-face time during this encounter.  Return in about 4 weeks (around 08/25/2020) for in person, ROB, 2 hr glucola next visit, Low risk.  Future Appointments  Date Time Provider Department Center  07/28/2020  4:00 PM Rahi Chandonnet, MD [facility] None  08/01/2020  9:30 AM [facility] NURSE [facility]  08/01/2020  9:45 AM [facility] US5 [facility]  08/25/2020  8:15 AM [facility] LAB [facility] None  08/25/2020  8:35 AM Raelyn Mora, CNM [facility] None    Catalina Antigua, MD Center for [facility], [facility]

## 2020-08-01 ENCOUNTER — Encounter: Payer: Self-pay | Admitting: *Deleted

## 2020-08-01 ENCOUNTER — Other Ambulatory Visit: Payer: Self-pay

## 2020-08-01 ENCOUNTER — Ambulatory Visit: Payer: Managed Care, Other (non HMO) | Attending: Obstetrics and Gynecology

## 2020-08-01 ENCOUNTER — Ambulatory Visit: Payer: Managed Care, Other (non HMO) | Admitting: *Deleted

## 2020-08-01 DIAGNOSIS — Z363 Encounter for antenatal screening for malformations: Secondary | ICD-10-CM

## 2020-08-01 DIAGNOSIS — J45909 Unspecified asthma, uncomplicated: Secondary | ICD-10-CM | POA: Diagnosis not present

## 2020-08-01 DIAGNOSIS — O99512 Diseases of the respiratory system complicating pregnancy, second trimester: Secondary | ICD-10-CM | POA: Diagnosis not present

## 2020-08-01 DIAGNOSIS — O09522 Supervision of elderly multigravida, second trimester: Secondary | ICD-10-CM

## 2020-08-01 DIAGNOSIS — Z148 Genetic carrier of other disease: Secondary | ICD-10-CM | POA: Insufficient documentation

## 2020-08-01 DIAGNOSIS — O321XX0 Maternal care for breech presentation, not applicable or unspecified: Secondary | ICD-10-CM

## 2020-08-01 DIAGNOSIS — O09529 Supervision of elderly multigravida, unspecified trimester: Secondary | ICD-10-CM | POA: Insufficient documentation

## 2020-08-01 DIAGNOSIS — Z3A25 25 weeks gestation of pregnancy: Secondary | ICD-10-CM

## 2020-08-25 ENCOUNTER — Ambulatory Visit (INDEPENDENT_AMBULATORY_CARE_PROVIDER_SITE_OTHER): Payer: Managed Care, Other (non HMO) | Admitting: Obstetrics and Gynecology

## 2020-08-25 ENCOUNTER — Other Ambulatory Visit: Payer: Self-pay

## 2020-08-25 ENCOUNTER — Other Ambulatory Visit: Payer: Managed Care, Other (non HMO)

## 2020-08-25 ENCOUNTER — Encounter: Payer: Self-pay | Admitting: Obstetrics and Gynecology

## 2020-08-25 VITALS — BP 123/75 | HR 105 | Wt 184.0 lb

## 2020-08-25 DIAGNOSIS — O09529 Supervision of elderly multigravida, unspecified trimester: Secondary | ICD-10-CM

## 2020-08-25 DIAGNOSIS — R8271 Bacteriuria: Secondary | ICD-10-CM

## 2020-08-25 DIAGNOSIS — Z3A28 28 weeks gestation of pregnancy: Secondary | ICD-10-CM

## 2020-08-25 NOTE — Progress Notes (Signed)
  HIGH-RISK PREGNANCY OFFICE VISIT Patient name: Rachael Baker MRN [id number]  Date of birth: 07-Nov-1980 Chief Complaint:   Routine Prenatal Visit  History of Present Illness:   Rachael Baker is a 40 y.o. 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 Contractions: Not present. Vag. Bleeding: None.  Movement: Present. denies leaking of fluid.  Review of Systems:   Pertinent items are noted in HPI Denies abnormal vaginal discharge w/ itching/odor/irritation, headaches, visual changes, shortness of breath, chest pain, abdominal pain, severe nausea/vomiting, or problems with urination or bowel movements unless otherwise stated above. Pertinent History Reviewed:  Reviewed past medical,surgical, social, obstetrical and family history.  Reviewed problem list, medications and allergies. Physical Assessment:   Vitals:   08/25/20 0828  BP: 123/75  Pulse: (!) 105  Weight: 184 lb (83.5 kg)  Body mass index is 28.82 kg/m.           Physical Examination:   General appearance: alert, well appearing, and in no distress  Mental status: alert, oriented to person, place, and time  Skin: warm & dry   Extremities: Edema: None    Cardiovascular: normal heart rate noted  Respiratory: normal respiratory effort, no distress  Abdomen: gravid, soft, non-tender  Pelvic: Cervical exam deferred         Fetal Status: Fetal Heart Rate (bpm): 138 Fundal Height: 29 cm Movement: Present    Fetal Surveillance Testing today: none   No results found for this or any previous visit (from the past 24 hour(s)).  Assessment & Plan:  1) High-risk pregnancy G4P3003 at [redacted]w[redacted]d with an Estimated Date of Delivery: 11/12/20   2) Encounter for supervision of high-risk pregnancy with multigravida of advanced maternal age  - Glucose  Tolerance, 2 Hours w/1 Hour,  - HIV antibody (with reflex),  - RPR,  - CBC,  - Patient decline TdaP - Taking bASA daily  3) Group B streptococcal bacteriuria - Prophylaxis in labor  3) [redacted] weeks gestation of pregnancy   Meds: No orders of the defined types were placed in this encounter.   Labs/procedures today: 2 hr GTT, 3rd trimester labs, TdaP  Treatment Plan:  Continue current plan  Reviewed: Preterm labor symptoms and general obstetric precautions including but not limited to vaginal bleeding, contractions, leaking of fluid and fetal movement were reviewed in detail with the patient.  All questions were answered. Has home bp cuff and scale. Check bp weekly, let us know if >140/90.   Follow-up: Return in about 4 weeks (around 09/22/2020) for Return OB - My Chart video.  Orders Placed This Encounter  Procedures  . Glucose Tolerance, 2 Hours w/1 Hour  . HIV antibody (with reflex)  . RPR  . CBC   Raelyn Mora MSN, CNM 08/25/2020 8:24 AM

## 2020-08-25 NOTE — Patient Instructions (Signed)
Fetal Movement Counts Patient Name: ________________________________________________ Patient Due Date: ____________________  What is a fetal movement count? A fetal movement count is the number of times that you feel your baby move during a certain amount of time. This may also be called a fetal kick count. A fetal movement count is recommended for every pregnant woman. You may be asked to start counting fetal movements as early as week 28 of your pregnancy. Pay attention to when your baby is most active. You may notice your baby's sleep and wake cycles. You may also notice things that make your baby move more. You should do a fetal movement count:  When your baby is normally most active.  At the same time each day. A good time to count movements is while you are resting, after having something to eat and drink. How do I count fetal movements? 1. Find a quiet, comfortable area. Sit, or lie down on your side. 2. Write down the date, the start time and stop time, and the number of movements that you felt between those two times. Take this information with you to your health care visits. 3. Write down your start time when you feel the first movement. 4. Count kicks, flutters, swishes, rolls, and jabs. You should feel at least 10 movements. 5. You may stop counting after you have felt 10 movements, or if you have been counting for 2 hours. Write down the stop time. 6. If you do not feel 10 movements in 2 hours, contact your health care provider for further instructions. Your health care provider may want to do additional tests to assess your baby's well-being. Contact a health care provider if:  You feel fewer than 10 movements in 2 hours.  Your baby is not moving like he or she usually does. Date: ____________ Start time: ____________ Stop time: ____________ Movements: ____________ Date: ____________ Start time: ____________ Stop time: ____________ Movements: ____________ Date: ____________  Start time: ____________ Stop time: ____________ Movements: ____________ Date: ____________ Start time: ____________ Stop time: ____________ Movements: ____________ Date: ____________ Start time: ____________ Stop time: ____________ Movements: ____________ Date: ____________ Start time: ____________ Stop time: ____________ Movements: ____________ Date: ____________ Start time: ____________ Stop time: ____________ Movements: ____________ Date: ____________ Start time: ____________ Stop time: ____________ Movements: ____________ Date: ____________ Start time: ____________ Stop time: ____________ Movements: ____________ This information is not intended to replace advice given to you by your health care provider. Make sure you discuss any questions you have with your health care provider. Document Revised: 01/11/2019 Document Reviewed: 01/11/2019 Elsevier Patient Education  2021 [employer] Iron-Rich Diet  Iron is a mineral that helps your body to produce hemoglobin. Hemoglobin is a protein in red blood cells that carries oxygen to your body's tissues. Eating too little iron may cause you to feel weak and tired, and it can increase your risk of infection. Iron is naturally found in many foods, and many foods have iron added to them (iron-fortified foods). You may need to follow an iron-rich diet if you do not have enough iron in your body due to certain medical conditions. The amount of iron that you need each day depends on your age, your sex, and any medical conditions you have. Follow instructions from your health care provider or a diet and nutrition specialist (dietitian) about how much iron you should eat each day. What are tips for following this plan? Reading food labels  Check food labels to see how many milligrams (mg) of iron are in each  serving. Cooking  Cook foods in pots and pans that are made from iron.  Take these steps to make it easier for your body to absorb iron from certain  foods: ? Soak beans overnight before cooking. ? Soak whole grains overnight and drain them before using. ? Ferment flours before baking, such as by using yeast in bread dough. Meal planning  When you eat foods that contain iron, you should eat them with foods that are high in vitamin C. These include oranges, peppers, tomatoes, potatoes, and mango. Vitamin C helps your body to absorb iron. General information  Take iron supplements only as told by your health care provider. An overdose of iron can be life-threatening. If you were prescribed iron supplements, take them with orange juice or a vitamin C supplement.  When you eat iron-fortified foods or take an iron supplement, you should also eat foods that naturally contain iron, such as meat, poultry, and fish. Eating naturally iron-rich foods helps your body to absorb the iron that is added to other foods or contained in a supplement.  Certain foods and drinks prevent your body from absorbing iron properly. Avoid eating these foods in the same meal as iron-rich foods or with iron supplements. These foods include: ? Coffee, black tea, and red wine. ? Milk, dairy products, and foods that are high in calcium. ? Beans and soybeans. ? Whole grains. What foods should I eat? Fruits Prunes. Raisins. Eat fruits high in vitamin C, such as oranges, grapefruits, and strawberries, alongside iron-rich foods. Vegetables Spinach (cooked). Green peas. Broccoli. Fermented vegetables. Eat vegetables high in vitamin C, such as leafy greens, potatoes, bell peppers, and tomatoes, alongside iron-rich foods. Grains Iron-fortified breakfast cereal. Iron-fortified whole-wheat bread. Enriched rice. Sprouted grains. Meats and other proteins Beef liver. Oysters. Beef. Shrimp. [place]. Chicken. Tuna. Sardines. Chickpeas. Nuts. Tofu. Pumpkin seeds. Beverages Tomato juice. Fresh orange juice. Prune juice. Hibiscus tea. Fortified instant breakfast shakes. Sweets and  desserts Blackstrap molasses. Seasonings and condiments Tahini. Fermented soy sauce. Other foods Wheat germ. The items listed above may not be a complete list of recommended foods and beverages. Contact a dietitian for more information. What foods should I avoid? Grains Whole grains. Bran cereal. Bran flour. Oats. Meats and other proteins Soybeans. Products made from soy protein. Black beans. Lentils. Mung beans. Split peas. Dairy Milk. Cream. Cheese. Yogurt. Cottage cheese. Beverages Coffee. Black tea. Red wine. Sweets and desserts Cocoa. Chocolate. Ice cream. Other foods Basil. Oregano. Large amounts of parsley. The items listed above may not be a complete list of foods and beverages to avoid. Contact a dietitian for more information. Summary  Iron is a mineral that helps your body to produce hemoglobin. Hemoglobin is a protein in red blood cells that carries oxygen to your body's tissues.  Iron is naturally found in many foods, and many foods have iron added to them (iron-fortified foods).  When you eat foods that contain iron, you should eat them with foods that are high in vitamin C. Vitamin C helps your body to absorb iron.  Certain foods and drinks prevent your body from absorbing iron properly, such as whole grains and dairy products. You should avoid eating these foods in the same meal as iron-rich foods or with iron supplements. This information is not intended to replace advice given to you by your health care provider. Make sure you discuss any questions you have with your health care provider. Document Revised: 05/06/2017 Document Reviewed: 04/19/2017 Elsevier Patient Education  2021 Elsevier  Inc[employer]  

## 2020-08-26 LAB — GLUCOSE TOLERANCE, 2 HOURS W/ 1HR
Glucose, 1 hour: 109 mg/dL (ref 65–179)
Glucose, 2 hour: 93 mg/dL (ref 65–152)
Glucose, Fasting: 75 mg/dL (ref 65–91)

## 2020-08-26 LAB — CBC
Hematocrit: 34.5 % (ref 34.0–46.6)
Hemoglobin: 11.4 g/dL (ref 11.1–15.9)
MCH: 32.3 pg (ref 26.6–33.0)
MCHC: 33 g/dL (ref 31.5–35.7)
MCV: 98 fL — ABNORMAL HIGH (ref 79–97)
Platelets: 215 10*3/uL (ref 150–450)
RBC: 3.53 x10E6/uL — ABNORMAL LOW (ref 3.77–5.28)
RDW: 12.8 % (ref 11.7–15.4)
WBC: 13.4 10*3/uL — ABNORMAL HIGH (ref 3.4–10.8)

## 2020-08-26 LAB — HIV ANTIBODY (ROUTINE TESTING W REFLEX): HIV Screen 4th Generation wRfx: NONREACTIVE

## 2020-08-26 LAB — RPR: RPR Ser Ql: NONREACTIVE

## 2020-09-22 ENCOUNTER — Telehealth (INDEPENDENT_AMBULATORY_CARE_PROVIDER_SITE_OTHER): Payer: Managed Care, Other (non HMO) | Admitting: Obstetrics and Gynecology

## 2020-09-22 VITALS — BP 125/85 | HR 88

## 2020-09-22 DIAGNOSIS — Z3A32 32 weeks gestation of pregnancy: Secondary | ICD-10-CM | POA: Insufficient documentation

## 2020-09-22 DIAGNOSIS — O99820 Streptococcus B carrier state complicating pregnancy: Secondary | ICD-10-CM

## 2020-09-22 DIAGNOSIS — O09529 Supervision of elderly multigravida, unspecified trimester: Secondary | ICD-10-CM

## 2020-09-22 DIAGNOSIS — R8271 Bacteriuria: Secondary | ICD-10-CM

## 2020-09-22 DIAGNOSIS — O09523 Supervision of elderly multigravida, third trimester: Secondary | ICD-10-CM

## 2020-09-22 NOTE — Progress Notes (Signed)
I connected with  Rachael Baker on 09/22/20 by a video enabled telemedicine application and verified that I am speaking with the correct person using two identifiers.   I discussed the limitations of evaluation and management by telemedicine. The patient expressed understanding and agreed to proceed.   MyChart OB, unable to check BP now as she unable to go downstairs to get it.  Patient will check BP when her husband gets home and send info via Mychart.

## 2020-09-22 NOTE — Progress Notes (Signed)
   OBSTETRICS PRENATAL VIRTUAL VISIT ENCOUNTER NOTE  [facility]   Patient location: Home  I connected with Rachael Baker on 09/22/20 at  4:15 PM EDT by MyChart Video Encounter and verified that I am speaking with the correct person using two identifiers. I discussed the limitations, risks, security and privacy concerns of performing an evaluation and management service virtually and the availability of in person appointments. I also discussed with the patient that there may be a patient responsible charge related to this service. The patient expressed understanding and agreed to proceed. Subjective:  Rachael Baker is a 40 y.o. G4P3003 at [redacted]w[redacted]d being seen today for ongoing prenatal care.  She is currently monitored for the following issues for this low-risk pregnancy and has Encounter for supervision of high-risk pregnancy with multigravida of advanced maternal age; Group B streptococcal bacteriuria; and [redacted] weeks gestation of pregnancy on their problem list.  Patient reports no complaints.  Contractions: Not present. Vag. Bleeding: None.  Movement: Present. Denies any leaking of fluid.   The following portions of the patient's history were reviewed and updated as appropriate: allergies, current medications, past family history, past medical history, past social history, past surgical history and problem list.   Objective:  There were no vitals filed for this visit.  Fetal Status:     Movement: Present     General:  Alert, oriented and cooperative. Patient is in no acute distress.  Respiratory: Normal respiratory effort, no problems with respiration noted  Mental Status: Normal mood and affect. Normal behavior. Normal judgment and thought content.  Rest of physical exam deferred due to type of encounter  Imaging: No results found.  Assessment and Plan:  Pregnancy: G4P3003 at [redacted]w[redacted]d 1. Encounter for supervision of high-risk pregnancy  with multigravida of advanced maternal age Pt desires virtual visit at 34 weeks but desires cervical exam at 36 week check up Discussed birth control options including long term devices.  Info place in AVS  2. [redacted] weeks gestation of pregnancy   3. Group B streptococcal bacteriuria Treat in labor  Preterm labor symptoms and general obstetric precautions including but not limited to vaginal bleeding, contractions, leaking of fluid and fetal movement were reviewed in detail with the patient. I discussed the assessment and treatment plan with the patient. The patient was provided an opportunity to ask questions and all were answered. The patient agreed with the plan and demonstrated an understanding of the instructions. The patient was advised to call back or seek an in-person office evaluation/go to [facility] at [facility] for any urgent or concerning symptoms. Please refer to After Visit Summary for other counseling recommendations.   I provided 10 minutes of face-to-face time during this encounter.  Return in about 2 weeks (around 10/06/2020) for ROB, virtual.  Future Appointments  Date Time Provider Department Center  09/22/2020  4:15 PM Warden Fillers, MD [facility] None    Warden Fillers, MD [facility], [facility]

## 2020-09-22 NOTE — Patient Instructions (Signed)
Etonogestrel implant What is this medicine? ETONOGESTREL (et oh noe JES trel) is a contraceptive (birth control) device. It is used to prevent pregnancy. It can be used for up to 3 years. This medicine may be used for other purposes; ask your health care provider or pharmacist if you have questions. COMMON BRAND NAME(S): Implanon, Nexplanon What should I tell my health care provider before I take this medicine? They need to know if you have any of these conditions:  abnormal vaginal bleeding  blood vessel disease or blood clots  breast, cervical, endometrial, ovarian, liver, or uterine cancer  diabetes  gallbladder disease  heart disease or recent heart attack  high blood pressure  high cholesterol or triglycerides  kidney disease  liver disease  migraine headaches  seizures  stroke  tobacco smoker  an unusual or allergic reaction to etonogestrel, anesthetics or antiseptics, other medicines, foods, dyes, or preservatives  pregnant or trying to get pregnant  breast-feeding How should I use this medicine? This device is inserted just under the skin on the inner side of your upper arm by a health care professional. Talk to your pediatrician regarding the use of this medicine in children. Special care may be needed. Overdosage: If you think you have taken too much of this medicine contact a poison control center or emergency room at once. NOTE: This medicine is only for you. Do not share this medicine with others. What if I miss a dose? This does not apply. What may interact with this medicine? Do not take this medicine with any of the following medications:  amprenavir  fosamprenavir This medicine may also interact with the following medications:  acitretin  aprepitant  armodafinil  bexarotene  bosentan  carbamazepine  certain medicines for fungal infections like fluconazole, ketoconazole, itraconazole and voriconazole  certain medicines to treat  hepatitis, HIV or AIDS  cyclosporine  felbamate  griseofulvin  lamotrigine  modafinil  oxcarbazepine  phenobarbital  phenytoin  primidone  rifabutin  rifampin  rifapentine  St. John's wort  topiramate This list may not describe all possible interactions. Give your health care provider a list of all the medicines, herbs, non-prescription drugs, or dietary supplements you use. Also tell them if you smoke, drink alcohol, or use illegal drugs. Some items may interact with your medicine. What should I watch for while using this medicine? This product does not protect you against HIV infection (AIDS) or other sexually transmitted diseases. You should be able to feel the implant by pressing your fingertips over the skin where it was inserted. Contact your doctor if you cannot feel the implant, and use a non-hormonal birth control method (such as condoms) until your doctor confirms that the implant is in place. Contact your doctor if you think that the implant may have broken or become bent while in your arm. You will receive a user card from your health care provider after the implant is inserted. The card is a record of the location of the implant in your upper arm and when it should be removed. Keep this card with your health records. What side effects may I notice from receiving this medicine? Side effects that you should report to your doctor or health care professional as soon as possible:  allergic reactions like skin rash, itching or hives, swelling of the face, lips, or tongue  breast lumps, breast tissue changes, or discharge  breathing problems  changes in emotions or moods  coughing up blood  if you feel that the implant   may have broken or bent while in your arm  high blood pressure  pain, irritation, swelling, or bruising at the insertion site  scar at site of insertion  signs of infection at the insertion site such as fever, and skin redness, pain or  discharge  signs and symptoms of a blood clot such as breathing problems; changes in vision; chest pain; severe, sudden headache; pain, swelling, warmth in the leg; trouble speaking; sudden numbness or weakness of the face, arm or leg  signs and symptoms of liver injury like dark yellow or brown urine; general ill feeling or flu-like symptoms; light-colored stools; loss of appetite; nausea; right upper belly pain; unusually weak or tired; yellowing of the eyes or skin  unusual vaginal bleeding, discharge Side effects that usually do not require medical attention (report to your doctor or health care professional if they continue or are bothersome):  acne  breast pain or tenderness  headache  irregular menstrual bleeding  nausea This list may not describe all possible side effects. Call your doctor for medical advice about side effects. You may report side effects to FDA at 1-800-FDA-1088. Where should I keep my medicine? This drug is given in a hospital or clinic and will not be stored at home. NOTE: This sheet is a summary. It may not cover all possible information. If you have questions about this medicine, talk to your doctor, pharmacist, or health care provider.  2021 Elsevier/Gold Standard (2019-03-06 11:33:04) Intrauterine Device Information An intrauterine device (IUD) is a medical device that is inserted into the uterus to prevent pregnancy. It is a small, T-shaped device that has one or two nylon strings hanging down from it. The strings hang out of the lower part of the uterus (cervix) to allow for future IUD removal. There are two types of IUDs:  Hormone IUD. This type of IUD is made of plastic and contains the hormone progestin (synthetic progesterone). A hormone IUD may last 3-5 years.  Copper IUD. This type of IUD has copper wire wrapped around it. A copper IUD may last up to 10 years. How is an IUD inserted? An IUD is inserted through the vagina, through the cervix, and  into the uterus with a minor medical procedure. The procedure for IUD insertion may vary among health care providers and hospitals. How does an IUD work? Synthetic progesterone in a hormonal IUD prevents pregnancy by:  Thickening cervical mucus to prevent sperm from entering the uterus.  Thinning the uterine lining to prevent a fertilized egg from being implanted there. Copper in a copper IUD prevents pregnancy by making the uterus and fallopian tubes produce a fluid that kills sperm. What are the advantages of an IUD? Advantages of either type of IUD An IUD:  Is highly effective in preventing pregnancy.  Is reversible. You can become pregnant shortly after the IUD is removed.  Is low-maintenance and can stay in place for a long time.  Has no estrogen-related side effects.  Can be used when breastfeeding.  Is not associated with weight gain.  Can be inserted right after childbirth, an abortion, or a miscarriage. Advantages of a hormone IUD  If it is inserted within 7 days of your period starting, it works right after it has been inserted. If the hormone IUD is inserted at any other time in your cycle, you will need to use a backup method of birth control for 7 days after insertion.  It can make menstrual periods lighter or stop completely.  It  can reduce menstrual cramping and other discomforts from menstrual periods.  It can be used for 3-5 years, depending on which IUD you have. Advantages of a copper IUD  It works right after it is inserted.  It can be used as a form of emergency birth control if it is inserted within 5 days after having unprotected sex.  It does not interfere with your body's natural hormones.  It can be used for up to 10 years. What are the disadvantages of an IUD?  An IUD may cause irregular menstrual bleeding for a period of time after insertion.  It is common to have pain during insertion and have cramping and vaginal bleeding after  insertion.  An IUD may cut the uterus (uterine perforation) when it is inserted. This is rare.  Pelvic inflammatory disease (PID) may happen after insertion of an IUD. PID is an infection in the uterus and fallopian tubes. The IUD does not cause the infection. The infection is usually from an unknown sexually transmitted infection (STI). This is rare, and it usually happens during the first 20 days after the IUD is inserted.  A copper IUD can make your menstrual flow heavier and more painful.  IUDs cannot prevent sexually transmitted infections (STIs). How is an IUD removed?   You will lie on your back with your knees bent and your feet in footrests (stirrups).  A device will be inserted into your vagina to spread apart the vaginal walls (speculum). This will allow your health care provider to see the strings attached to the IUD.  Your health care provider will use a small instrument (forceps) to grasp the IUD strings and will pull firmly until the IUD is removed. You may have some discomfort when the IUD is removed. Your health care provider may recommend taking over-the-counter pain relievers, such as ibuprofen, before the procedure. You may also have minor spotting for a few days after the procedure. The procedure for IUD removal may vary among health care providers and hospitals. Is an IUD right for me? If you are interested in an IUD, discuss it with your health care provider. He or she will make sure you are a good candidate for an IUD and will let you know more about the advantages, disadvantage, and possible side effects. This will allow you to make a decision about the device. Summary  An intrauterine device (IUD) is a medical device that is inserted in the uterus to prevent pregnancy. It is a small, T-shaped device that has one or two nylon strings hanging down from it.  A hormone IUD contains the hormone progestin (synthetic progesterone). A copper IUD has copper wire wrapped  around it.  Synthetic progesterone in a hormone IUD prevents pregnancy by thickening cervical mucus and thinning the walls of the uterus. Copper in a copper IUD prevents pregnancy by making the uterus and fallopian tubes produce a fluid that kills sperm.  A hormone IUD can be left in place for 3-5 years. A copper IUD can be left in place for up to 10 years.  An IUD is inserted and removed by a health care provider. You may feel some pain during insertion and removal. Your health care provider may recommend taking over-the-counter pain medicine, such as ibuprofen, before an IUD procedure. This information is not intended to replace advice given to you by your health care provider. Make sure you discuss any questions you have with your health care provider. Document Revised: 12/05/2019 Document Reviewed: 12/05/2019 Elsevier Patient  Education  2021 Elsevier Inc. Levonorgestrel intrauterine device (IUD) What is this medicine? LEVONORGESTREL IUD (LEE voe nor jes trel) is a contraceptive (birth control) device. The device is placed inside the uterus by a health care provider. It is used to prevent pregnancy. Some devices can also be used to treat heavy bleeding that occurs during your period. This medicine may be used for other purposes; ask your health care provider or pharmacist if you have questions. COMMON BRAND NAME(S): Cameron AliKyleena, LILETTA, Mirena, Skyla What should I tell my health care provider before I take this medicine? They need to know if you have any of these conditions:  abnormal Pap smear  cancer of the breast, uterus, or cervix  diabetes  endometritis  genital or pelvic infection now or in the past  have more than one sexual partner or your partner has more than one partner  heart disease  history of an ectopic or tubal pregnancy  immune system problems  IUD in place  liver disease or tumor  problems with blood clots or take blood-thinners  seizures  use  intravenous drugs  uterus of unusual shape  vaginal bleeding that has not been explained  an unusual or allergic reaction to levonorgestrel, other hormones, silicone, or polyethylene, medicines, foods, dyes, or preservatives  pregnant or trying to get pregnant  breast-feeding How should I use this medicine? This device is placed inside the uterus by a health care professional. Talk to your pediatrician regarding the use of this medicine in children. Special care may be needed. Overdosage: If you think you have taken too much of this medicine contact a poison control center or emergency room at once. NOTE: This medicine is only for you. Do not share this medicine with others. What if I miss a dose? This does not apply. Depending on the brand of device you have inserted, the device will need to be replaced every 3 to 7 years if you wish to continue using this type of birth control. What may interact with this medicine? Do not take this medicine with any of the following medications:  amprenavir  bosentan  fosamprenavir This medicine may also interact with the following medications:  aprepitant  armodafinil  barbiturate medicines for inducing sleep or treating seizures  bexarotene  boceprevir  griseofulvin  medicines to treat seizures like carbamazepine, ethotoin, felbamate, oxcarbazepine, phenytoin, topiramate  modafinil  pioglitazone  rifabutin  rifampin  rifapentine  some medicines to treat HIV infection like atazanavir, efavirenz, indinavir, lopinavir, nelfinavir, tipranavir, ritonavir  St. John's wort  warfarin This list may not describe all possible interactions. Give your health care provider a list of all the medicines, herbs, non-prescription drugs, or dietary supplements you use. Also tell them if you smoke, drink alcohol, or use illegal drugs. Some items may interact with your medicine. What should I watch for while using this medicine? Visit your  doctor or health care professional for regular check ups. See your doctor if you or your partner has sexual contact with others, becomes HIV positive, or gets a sexual transmitted disease. This product does not protect you against HIV infection (AIDS) or other sexually transmitted diseases. You can check the placement of the IUD yourself by reaching up to the top of your vagina with clean fingers to feel the threads. Do not pull on the threads. It is a good habit to check placement after each menstrual period. Call your doctor right away if you feel more of the IUD than just the threads or  if you cannot feel the threads at all. The IUD may come out by itself. You may become pregnant if the device comes out. If you notice that the IUD has come out use a backup birth control method like condoms and call your health care provider. Using tampons will not change the position of the IUD and are okay to use during your period. This IUD can be safely scanned with magnetic resonance imaging (MRI) only under specific conditions. Before you have an MRI, tell your healthcare provider that you have an IUD in place, and which type of IUD you have in place. What side effects may I notice from receiving this medicine? Side effects that you should report to your doctor or health care professional as soon as possible:  allergic reactions like skin rash, itching or hives, swelling of the face, lips, or tongue  fever, flu-like symptoms  genital sores  high blood pressure  no menstrual period for 6 weeks during use  pain, swelling, warmth in the leg  pelvic pain or tenderness  severe or sudden headache  signs of pregnancy  stomach cramping  sudden shortness of breath  trouble with balance, talking, or walking  unusual vaginal bleeding, discharge  yellowing of the eyes or skin Side effects that usually do not require medical attention (report to your doctor or health care professional if they continue  or are bothersome):  acne  breast pain  change in sex drive or performance  changes in weight  cramping, dizziness, or faintness while the device is being inserted  headache  irregular menstrual bleeding within first 3 to 6 months of use  nausea This list may not describe all possible side effects. Call your doctor for medical advice about side effects. You may report side effects to FDA at 1-800-FDA-1088. Where should I keep my medicine? This does not apply. NOTE: This sheet is a summary. It may not cover all possible information. If you have questions about this medicine, talk to your doctor, pharmacist, or health care provider.  2021 Elsevier/Gold Standard (2020-01-22 16:27:45)

## 2020-09-24 ENCOUNTER — Other Ambulatory Visit: Payer: Self-pay

## 2020-09-24 DIAGNOSIS — R12 Heartburn: Secondary | ICD-10-CM

## 2020-09-24 DIAGNOSIS — O26893 Other specified pregnancy related conditions, third trimester: Secondary | ICD-10-CM

## 2020-09-24 DIAGNOSIS — K219 Gastro-esophageal reflux disease without esophagitis: Secondary | ICD-10-CM

## 2020-09-24 MED ORDER — OMEPRAZOLE MAGNESIUM 20 MG PO TBEC: 20.0000 mg | DELAYED_RELEASE_TABLET | Freq: Every day | ORAL | 0 refills | Status: DC

## 2020-09-24 NOTE — Progress Notes (Signed)
I called patient regarding FMLA paper work. During call, pt c/o heartburn and acid reflux no relief with Tums Prilosec sent to the pharmacy per protocol  Pt to contact the office if sx's worsen

## 2020-10-06 ENCOUNTER — Telehealth (INDEPENDENT_AMBULATORY_CARE_PROVIDER_SITE_OTHER): Payer: Managed Care, Other (non HMO) | Admitting: Advanced Practice Midwife

## 2020-10-06 ENCOUNTER — Telehealth: Payer: Managed Care, Other (non HMO) | Admitting: Advanced Practice Midwife

## 2020-10-06 VITALS — BP 125/70 | HR 99

## 2020-10-06 DIAGNOSIS — Z3A38 38 weeks gestation of pregnancy: Secondary | ICD-10-CM

## 2020-10-06 DIAGNOSIS — R8271 Bacteriuria: Secondary | ICD-10-CM

## 2020-10-06 DIAGNOSIS — O99820 Streptococcus B carrier state complicating pregnancy: Secondary | ICD-10-CM

## 2020-10-06 DIAGNOSIS — O09529 Supervision of elderly multigravida, unspecified trimester: Secondary | ICD-10-CM

## 2020-10-06 DIAGNOSIS — O09523 Supervision of elderly multigravida, third trimester: Secondary | ICD-10-CM

## 2020-10-06 DIAGNOSIS — Z3A34 34 weeks gestation of pregnancy: Secondary | ICD-10-CM

## 2020-10-06 NOTE — Progress Notes (Signed)
   OBSTETRICS PRENATAL VIRTUAL VISIT ENCOUNTER NOTE  Provider location: [facility]   Patient location: Home  I connected with Rachael Baker on 10/06/20 at  2:40 PM EDT by MyChart Video Encounter and verified that I am speaking with the correct person using two identifiers. I discussed the limitations, risks, security and privacy concerns of performing an evaluation and management service virtually and the availability of in person appointments. I also discussed with the patient that there may be a patient responsible charge related to this service. The patient expressed understanding and agreed to proceed. Subjective:  Rachael Baker is a 40 y.o. G4P3003 at [redacted]w[redacted]d being seen today for ongoing prenatal care.  She is currently monitored for the following issues for this high-risk pregnancy and has Encounter for supervision of high-risk pregnancy with multigravida of advanced maternal age; Group B streptococcal bacteriuria; and [redacted] weeks gestation of pregnancy on their problem list.  Patient reports no complaints.  Contractions: Not present. Vag. Bleeding: None.  Movement: Present. Denies any leaking of fluid.   The following portions of the patient's history were reviewed and updated as appropriate: allergies, current medications, past family history, past medical history, past social history, past surgical history and problem list.   Objective:   Vitals:   10/06/20 1421  BP: 125/70  Pulse: 99    Fetal Status:     Movement: Present     General:  Alert, oriented and cooperative. Patient is in no acute distress.  Respiratory: Normal respiratory effort, no problems with respiration noted  Mental Status: Normal mood and affect. Normal behavior. Normal judgment and thought content.  Rest of physical exam deferred due to type of encounter  Imaging: No results found.  Assessment and Plan:  Pregnancy: G4P3003 at [redacted]w[redacted]d 1. Encounter for supervision of high-risk  pregnancy with multigravida of advanced maternal age --Pt reports good fetal movement, denies cramping, LOF, or vaginal bleeding --Anticipatory guidance about next visits/weeks of pregnancy given. --Next visit in 2 weeks in office  2. [redacted] weeks gestation of pregnancy   3. Multigravida of advanced maternal age in third trimester  4. Group B streptococcal bacteriuria --Needs prophylaxis in labor, does not need swab at 36 week visit  Preterm labor symptoms and general obstetric precautions including but not limited to vaginal bleeding, contractions, leaking of fluid and fetal movement were reviewed in detail with the patient. I discussed the assessment and treatment plan with the patient. The patient was provided an opportunity to ask questions and all were answered. The patient agreed with the plan and demonstrated an understanding of the instructions. The patient was advised to call back or seek an in-person office evaluation/go to [facility]U at [facility] for any urgent or concerning symptoms. Please refer to After Visit Summary for other counseling recommendations.   I provided 5 minutes of face-to-face time during this encounter.  Return in about 2 weeks (around 10/20/2020).  No future appointments.  Sharen Counter, CNM [facility] [employer]

## 2020-10-20 ENCOUNTER — Other Ambulatory Visit (HOSPITAL_COMMUNITY)
Admission: RE | Admit: 2020-10-20 | Discharge: 2020-10-20 | Disposition: A | Discharge status: Home / Self Care | Payer: Managed Care, Other (non HMO) | Source: Ambulatory Visit | Attending: Advanced Practice Midwife | Admitting: Advanced Practice Midwife

## 2020-10-20 ENCOUNTER — Ambulatory Visit (INDEPENDENT_AMBULATORY_CARE_PROVIDER_SITE_OTHER): Payer: Managed Care, Other (non HMO) | Admitting: Advanced Practice Midwife

## 2020-10-20 ENCOUNTER — Other Ambulatory Visit: Payer: Self-pay

## 2020-10-20 VITALS — BP 126/74 | HR 97 | Wt 189.6 lb

## 2020-10-20 DIAGNOSIS — O09529 Supervision of elderly multigravida, unspecified trimester: Secondary | ICD-10-CM

## 2020-10-20 DIAGNOSIS — O09523 Supervision of elderly multigravida, third trimester: Secondary | ICD-10-CM

## 2020-10-20 DIAGNOSIS — Z3A38 38 weeks gestation of pregnancy: Secondary | ICD-10-CM

## 2020-10-20 DIAGNOSIS — R8271 Bacteriuria: Secondary | ICD-10-CM

## 2020-10-20 NOTE — Patient Instructions (Signed)
Things to Try After 37 weeks to Encourage Labor/Get Ready for Labor:   1.  Try the Miles Circuit at www.milescircuit.com daily to improve baby's position and encourage the onset of labor.  2. Walk a little and rest a little every day.  Change positions often.  3. Cervical Ripening: May try one or both a. Red Raspberry Leaf capsules or tea:  two 300mg or 400mg tablets with each meal, 2-3 times a day, or 1-3 cups of tea daily  Potential Side Effects Of Raspberry Leaf:  Most women do not experience any side effects from drinking raspberry leaf tea. However, nausea and loose stools are possible   b. Evening Primrose Oil capsules: take 1 capsule by mouth and place one capsule in the vagina every night.    Some of the potential side effects:  Upset stomach  Loose stools or diarrhea  Headaches  Nausea  4. Sex can also help the cervix ripen and encourage labor onset.    Labor Precautions Reasons to come to [facility]U at [facility]:  1.  Contractions are  5 minutes apart or less, each last 1 minute, these have been going on for 1-2 hours, and you cannot walk or talk during them 2.  You have a large gush of fluid, or a trickle of fluid that will not stop and you have to wear a pad 3.  You have bleeding that is bright red, heavier than spotting--like menstrual bleeding (spotting can be normal in early labor or after a check of your cervix) 4.  You do not feel the baby moving like he/she normally does 

## 2020-10-20 NOTE — Progress Notes (Signed)
   PRENATAL VISIT NOTE  Subjective:  Rachael Baker is a 40 y.o. G4P3003 at [redacted]w[redacted]d being seen today for ongoing prenatal care.  She is currently monitored for the following issues for this high-risk pregnancy and has Encounter for supervision of high-risk pregnancy with multigravida of advanced maternal age; Group B streptococcal bacteriuria; and [redacted] weeks gestation of pregnancy on their problem list.  Patient reports occasional contractions.  Contractions: Irritability. Vag. Bleeding: None.  Movement: Present. Denies leaking of fluid.   The following portions of the patient's history were reviewed and updated as appropriate: allergies, current medications, past family history, past medical history, past social history, past surgical history and problem list.   Objective:   Vitals:   10/20/20 1620  BP: 126/74  Pulse: 97  Weight: 189 lb 9.6 oz (86 kg)    Fetal Status: Fetal Heart Rate (bpm): 135 Fundal Height: 36 cm Movement: Present  Presentation: Vertex  General:  Alert, oriented and cooperative. Patient is in no acute distress.  Skin: Skin is warm and dry. No rash noted.   Cardiovascular: Normal heart rate noted  Respiratory: Normal respiratory effort, no problems with respiration noted  Abdomen: Soft, gravid, appropriate for gestational age.  Pain/Pressure: Absent     Pelvic: Cervical exam performed in the presence of a chaperone Dilation: 1.5 Effacement (%): 50 Station: -2  Extremities: Normal range of motion.  Edema: Trace  Mental Status: Normal mood and affect. Normal behavior. Normal judgment and thought content.   Assessment and Plan:  Pregnancy: G4P3003 at [redacted]w[redacted]d 1. Encounter for supervision of high-risk pregnancy with multigravida of advanced maternal age --Anticipatory guidance about next visits/weeks of pregnancy given. --Next visit in 1 week --Labor readiness/labor precautions reviewed - Cervicovaginal ancillary only( [place])  2. Group B streptococcal  bacteriuria --Needs prophylaxis in labor  3. [redacted] weeks gestation of pregnancy   4. Multigravida of advanced maternal age in third trimester   Term labor symptoms and general obstetric precautions including but not limited to vaginal bleeding, contractions, leaking of fluid and fetal movement were reviewed in detail with the patient. Please refer to After Visit Summary for other counseling recommendations.   Return in about 1 week (around 10/27/2020).  No future appointments.  Sharen Counter, CNM

## 2020-10-22 LAB — CERVICOVAGINAL ANCILLARY ONLY
Chlamydia: NEGATIVE
Comment: NEGATIVE
Comment: NORMAL
Neisseria Gonorrhea: NEGATIVE

## 2020-10-27 ENCOUNTER — Other Ambulatory Visit: Payer: Self-pay

## 2020-10-27 ENCOUNTER — Ambulatory Visit (INDEPENDENT_AMBULATORY_CARE_PROVIDER_SITE_OTHER): Payer: Managed Care, Other (non HMO) | Admitting: Obstetrics and Gynecology

## 2020-10-27 ENCOUNTER — Other Ambulatory Visit: Payer: Self-pay | Admitting: Obstetrics & Gynecology

## 2020-10-27 VITALS — BP 120/70 | HR 96 | Wt 191.0 lb

## 2020-10-27 DIAGNOSIS — Z3A37 37 weeks gestation of pregnancy: Secondary | ICD-10-CM | POA: Insufficient documentation

## 2020-10-27 DIAGNOSIS — O09529 Supervision of elderly multigravida, unspecified trimester: Secondary | ICD-10-CM

## 2020-10-27 DIAGNOSIS — R8271 Bacteriuria: Secondary | ICD-10-CM

## 2020-10-27 DIAGNOSIS — O26893 Other specified pregnancy related conditions, third trimester: Secondary | ICD-10-CM

## 2020-10-27 NOTE — Progress Notes (Signed)
   PRENATAL VISIT NOTE  Subjective:  Rachael Baker is a 40 y.o. G4P3003 at [redacted]w[redacted]d being seen today for ongoing prenatal care.  She is currently monitored for the following issues for this low-risk pregnancy and has Encounter for supervision of high-risk pregnancy with multigravida of advanced maternal age; Group B streptococcal bacteriuria; [redacted] weeks gestation of pregnancy; and [redacted] weeks gestation of pregnancy on their problem list.  Patient doing well with no acute concerns today. She reports no complaints.  Contractions: Irritability. Vag. Bleeding: None.  Movement: Present. Denies leaking of fluid.   The following portions of the patient's history were reviewed and updated as appropriate: allergies, current medications, past family history, past medical history, past social history, past surgical history and problem list. Problem list updated.  Objective:   Vitals:   10/27/20 1547  BP: 120/70  Pulse: 96  Weight: 191 lb (86.6 kg)    Fetal Status: Fetal Heart Rate (bpm): 145 Fundal Height: 38 cm Movement: Present     General:  Alert, oriented and cooperative. Patient is in no acute distress.  Skin: Skin is warm and dry. No rash noted.   Cardiovascular: Normal heart rate noted  Respiratory: Normal respiratory effort, no problems with respiration noted  Abdomen: Soft, gravid, appropriate for gestational age.  Pain/Pressure: Present     Pelvic: Cervical exam performed Dilation: 1 Effacement (%): 50 Station: -2  Extremities: Normal range of motion.  Edema: Trace  Mental Status:  Normal mood and affect. Normal behavior. Normal judgment and thought content.   Assessment and Plan:  Pregnancy: G4P3003 at [redacted]w[redacted]d  1. [redacted] weeks gestation of pregnancy   2. Encounter for supervision of high-risk pregnancy with multigravida of advanced maternal age Routine prenatal visit  3. Group B streptococcal bacteriuria Treat in labor  Term labor symptoms and general obstetric precautions including  but not limited to vaginal bleeding, contractions, leaking of fluid and fetal movement were reviewed in detail with the patient.  Please refer to After Visit Summary for other counseling recommendations.   Return in about 1 week (around 11/03/2020) for in person.   Mariel Aloe, MD Faculty Attending Center for [facility]

## 2020-10-27 NOTE — Progress Notes (Signed)
OB wants her cervix checked, reports no problems today.

## 2020-11-05 ENCOUNTER — Other Ambulatory Visit: Payer: Self-pay

## 2020-11-05 ENCOUNTER — Ambulatory Visit (INDEPENDENT_AMBULATORY_CARE_PROVIDER_SITE_OTHER): Payer: Managed Care, Other (non HMO) | Admitting: Women's Health

## 2020-11-05 VITALS — BP 127/73 | HR 89 | Wt 188.6 lb

## 2020-11-05 DIAGNOSIS — Z3A39 39 weeks gestation of pregnancy: Secondary | ICD-10-CM

## 2020-11-05 DIAGNOSIS — R8271 Bacteriuria: Secondary | ICD-10-CM

## 2020-11-05 DIAGNOSIS — O09529 Supervision of elderly multigravida, unspecified trimester: Secondary | ICD-10-CM

## 2020-11-05 NOTE — Patient Instructions (Addendum)
Maternity Assessment Unit (MAU)  The Maternity Assessment Unit (MAU) is located at the [facility] and [place] at [facility]. The address is: [address], [place], [place], [place] [address]. Please see map below for additional directions.    The Maternity Assessment Unit is designed to help you during your pregnancy, and for up to 6 weeks after delivery, with any pregnancy- or postpartum-related emergencies, if you think you are in labor, or if your water has broken. For example, if you experience nausea and vomiting, vaginal bleeding, severe abdominal or pelvic pain, elevated blood pressure or other problems related to your pregnancy or postpartum time, please come to the Maternity Assessment Unit for assistance.        Signs and Symptoms of Labor Labor is the body's natural process of moving the baby and the placenta out of the uterus. The process of labor usually starts when the baby is full-term, between 23 and 40 weeks of pregnancy. Signs and symptoms that you are close to going into labor As your body prepares for labor and the birth of your baby, you may notice the following symptoms in the weeks and days before true labor starts:  Passing a small amount of thick, bloody mucus from your vagina. This is called normal bloody show or losing your mucus plug. This may happen more than a week before labor begins, or right before labor begins, as the opening of the cervix starts to widen (dilate). For some women, the entire mucus plug passes at once. For others, pieces of the mucus plug may gradually pass over several days.  Your baby moving (dropping) lower in your pelvis to get into position for birth (lightening). When this happens, you may feel more pressure on your bladder and pelvic bone and less pressure on your ribs. This may make it easier to breathe. It may also cause you to need to urinate more often and have problems with bowel movements.  Having "practice  contractions," also called Braxton Hicks contractions or false labor. These occur at irregular (unevenly spaced) intervals that are more than 10 minutes apart. False labor contractions are common after exercise or sexual activity. They will stop if you change position, rest, or drink fluids. These contractions are usually mild and do not get stronger over time. They may feel like: ? A backache or back pain. ? Mild cramps, similar to menstrual cramps. ? Tightening or pressure in your abdomen. Other early symptoms include:  Nausea or loss of appetite.  Diarrhea.  Having a sudden burst of energy, or feeling very tired.  Mood changes.  Having trouble sleeping.   Signs and symptoms that labor has begun Signs that you are in labor may include:  Having contractions that come at regular (evenly spaced) intervals and increase in intensity. This may feel like more intense tightening or pressure in your abdomen that moves to your back. ? Contractions may also feel like rhythmic pain in your upper thighs or back that comes and goes at regular intervals. ? For first-time mothers, this change in intensity of contractions often occurs at a more gradual pace. ? Women who have given birth before may notice a more rapid progression of contraction changes.  Feeling pressure in the vaginal area.  Your water breaking (rupture of membranes). This is when the sac of fluid that surrounds your baby breaks. Fluid leaking from your vagina may be clear or blood-tinged. Labor usually starts within 24 hours of your water breaking, but it  may take longer to begin. ? Some women may feel a sudden gush of fluid. ? Others notice that their underwear repeatedly becomes damp. Follow these instructions at home:  When labor starts, or if your water breaks, call your health care provider or nurse care line. Based on your situation, they will determine when you should go in for an exam.  During early labor, you may be able  to rest and manage symptoms at home. Some strategies to try at home include: ? Breathing and relaxation techniques. ? Taking a warm bath or shower. ? Listening to music. ? Using a heating pad on the lower back for pain. If you are directed to use heat:  Place a towel between your skin and the heat source.  Leave the heat on for 20-30 minutes.  Remove the heat if your skin turns bright red. This is especially important if you are unable to feel pain, heat, or cold. You may have a greater risk of getting burned.   Contact a health care provider if:  Your labor has started.  Your water breaks. Get help right away if:  You have painful, regular contractions that are 5 minutes apart or less.  Labor starts before you are [redacted] weeks along in your pregnancy.  You have a fever.  You have bright red blood coming from your vagina.  You do not feel your baby moving.  You have a severe headache with or without vision problems.  You have severe nausea, vomiting, or diarrhea.  You have chest pain or shortness of breath. These symptoms may represent a serious problem that is an emergency. Do not wait to see if the symptoms will go away. Get medical help right away. Call your local emergency services (911 in the [place] Do not drive yourself to the hospital. Summary  Labor is your body's natural process of moving your baby and the placenta out of your uterus.  The process of labor usually starts when your baby is full-term, between 43 and 40 weeks of pregnancy.  When labor starts, or if your water breaks, call your health care provider or nurse care line. Based on your situation, they will determine when you should go in for an exam. This information is not intended to replace advice given to you by your health care provider. Make sure you discuss any questions you have with your health care provider. Document Revised: 03/15/2020 Document Reviewed: 03/15/2020 Elsevier Patient Education  2021  Elsevier Inc.         Group B Streptococcus Infection During Pregnancy Group B Streptococcus (GBS) is a type of bacteria that is often found in healthy people. It is commonly found in the rectum, vagina, and intestines. In people who are healthy and not pregnant, the bacteria rarely cause serious illness or complications. However, women who test positive for GBS during pregnancy can pass the bacteria to the baby during childbirth. This can cause serious infection in the baby after birth. Women with GBS may also have infections during their pregnancy or soon after childbirth. The infections include urinary tract infections (UTIs) or infections of the uterus. GBS also increases a woman's risk of complications during pregnancy, such as early labor or delivery, miscarriage, or stillbirth. Routine testing for GBS is recommended for all pregnant women. What are the causes? This condition is caused by bacteria called Streptococcus agalactiae. What increases the risk? You may have a higher risk for GBS infection during pregnancy if you had one during a past  pregnancy. What are the signs or symptoms? In most cases, GBS infection does not cause symptoms in pregnant women. If symptoms exist, they may include:  Labor that starts before the 37th week of pregnancy.  A UTI or bladder infection. This may cause a fever, frequent urination, or pain and burning during urination.  Fever during labor. There can also be a rapid heartbeat in the mother or baby. Rare but serious symptoms of a GBS infection in women include:  Blood infection (septicemia). This may cause fever, chills, or confusion.  Lung infection (pneumonia). This may cause fever, chills, cough, rapid breathing, chest pain, or difficulty breathing.  Bone, joint, skin, or soft tissue infection. How is this diagnosed? You may be screened for GBS between week 35 and week 37 of pregnancy. If you have symptoms of preterm labor, you may be  screened earlier. This condition is diagnosed based on lab test results from:  A swab of fluid from the vagina and rectum.  A urine sample. How is this treated? This condition is treated with antibiotic medicine. Antibiotic medicine may be given:  To you when you go into labor, or as soon as your water breaks. The medicines will continue until after you give birth. If you are having a cesarean delivery, you do not need antibiotics unless your water has broken.  To your baby, if he or she requires treatment. Your health care provider will check your baby to decide if he or she needs antibiotics to prevent a serious infection.   Follow these instructions at home:  Take over-the-counter and prescription medicines only as told by your health care provider.  Take your antibiotic medicine as told by your health care provider. Do not stop taking the antibiotic even if you start to feel better.  Keep all pre-birth (prenatal) visits and follow-up visits as told by your health care provider. This is important. Contact a health care provider if:  You have pain or burning when you urinate.  You have to urinate more often than usual.  You have a fever or chills.  You develop a bad-smelling vaginal discharge. Get help right away if:  Your water breaks.  You go into labor.  You have severe pain in your abdomen.  You have difficulty breathing.  You have chest pain. These symptoms may represent a serious problem that is an emergency. Do not wait to see if the symptoms will go away. Get medical help right away. Call your local emergency services (911 in the [place] Do not drive yourself to the hospital. Summary  GBS is a type of bacteria that is common in healthy people.  During pregnancy, colonization with GBS can cause serious complications for you or your baby.  Your health care provider will screen you between 35 and 37 weeks of pregnancy to determine if you are colonized with  GBS.  If you are colonized with GBS during pregnancy, your health care provider will recommend antibiotics through an IV during labor.  After delivery, your baby will be evaluated for complications related to potential GBS infection and may require antibiotics to prevent a serious infection. This information is not intended to replace advice given to you by your health care provider. Make sure you discuss any questions you have with your health care provider. Document Revised: 03/25/2020 Document Reviewed: 12/18/2018 Elsevier Patient Education  2021 [employer].       New Induction of Labor Process for [employer] and [employer]  In Fall 2020  [facility] changed it's process for scheduling inductions of labor to create more induction slots and to make sure patients get COVID-19 testing in advance. After you have been tested you need to quarantine so that you do not get infected after your test. You should not go anywhere after your test except necessary medical appointments.  You have been scheduled for induction of labor on 11/19/2020. Although you may have a specific time listed on your After Visit Summary or MyChart, we cannot predict when your room will be available. Please disregard this time. A Labor and Delivery staff member will call you on the day that you are scheduled when your room is available. You will need to arrive within one hour of being called. If you do not arrive within this time frame, the next person on the list will be called in and you will move down the list. You may eat a light meal before coming to the hospital. If you go into labor, think your water has broken, experience bright red bleeding or don't feel your baby moving as much as usual before your induction, please call your Ob/Gyn's office or come to Entrance C, Maternity Assessment Unit for evaluation.  Thank you,  Center for [employer]

## 2020-11-05 NOTE — Progress Notes (Signed)
Subjective:  Rachael Baker is a 40 y.o. G4P3003 at [redacted]w[redacted]d being seen today for ongoing prenatal care.  She is currently monitored for the following issues for this low-risk pregnancy and has Encounter for supervision of high-risk pregnancy with multigravida of advanced maternal age and Group B streptococcal bacteriuria on their problem list.  Patient reports no complaints.  Contractions: Irregular. Vag. Bleeding: None.  Movement: Present. Denies leaking of fluid.   The following portions of the patient's history were reviewed and updated as appropriate: allergies, current medications, past family history, past medical history, past social history, past surgical history and problem list. Problem list updated.  Objective:   Vitals:   11/05/20 1621  BP: 127/73  Pulse: 89  Weight: 188 lb 9.6 oz (85.5 kg)    Fetal Status: Fetal Heart Rate (bpm): 147   Movement: Present     General:  Alert, oriented and cooperative. Patient is in no acute distress.  Skin: Skin is warm and dry. No rash noted.   Cardiovascular: Normal heart rate noted  Respiratory: Normal respiratory effort, no problems with respiration noted  Abdomen: Soft, gravid, appropriate for gestational age. Pain/Pressure: Present     Pelvic: Vag. Bleeding: None     Cervical exam performed        Extremities: Normal range of motion.  Edema: Trace  Mental Status: Normal mood and affect. Normal behavior. Normal judgment and thought content.   Urinalysis:      Assessment and Plan:  Pregnancy: G4P3003 at [redacted]w[redacted]d  1. Encounter for supervision of high-risk pregnancy with multigravida of advanced maternal age -IOL scheduled for 41 weeks  2. Group B streptococcal bacteriuria -treat in labor  3. [redacted] weeks gestation of pregnancy  Term labor symptoms and general obstetric precautions including but not limited to vaginal bleeding, contractions, leaking of fluid and fetal movement were reviewed in detail with the patient. I discussed the  assessment and treatment plan with the patient. The patient was provided an opportunity to ask questions and all were answered. The patient agreed with the plan and demonstrated an understanding of the instructions. The patient was advised to call back or seek an in-person office evaluation/go to MAU at [facility] for any urgent or concerning symptoms. Please refer to After Visit Summary for other counseling recommendations.  Return in about 1 week (around 11/12/2020) for in-person NST/OB/APP OK.   Makiah Clauson, Odie Sera, NP

## 2020-11-05 NOTE — Progress Notes (Signed)
Pt reports fetal movement with irregular contractions. 

## 2020-11-08 ENCOUNTER — Other Ambulatory Visit: Payer: Self-pay

## 2020-11-08 ENCOUNTER — Encounter (HOSPITAL_COMMUNITY): Payer: Self-pay | Admitting: Obstetrics and Gynecology

## 2020-11-08 ENCOUNTER — Inpatient Hospital Stay (HOSPITAL_COMMUNITY)
Admission: AD | Admit: 2020-11-08 | Discharge: 2020-11-10 | DRG: 807 | Disposition: A | Discharge status: Home / Self Care | Payer: Managed Care, Other (non HMO) | Attending: Obstetrics and Gynecology | Admitting: Obstetrics and Gynecology

## 2020-11-08 DIAGNOSIS — O09529 Supervision of elderly multigravida, unspecified trimester: Principal | ICD-10-CM

## 2020-11-08 DIAGNOSIS — O321XX0 Maternal care for breech presentation, not applicable or unspecified: Secondary | ICD-10-CM | POA: Diagnosis present

## 2020-11-08 DIAGNOSIS — R8271 Bacteriuria: Secondary | ICD-10-CM | POA: Diagnosis present

## 2020-11-08 DIAGNOSIS — O99824 Streptococcus B carrier state complicating childbirth: Secondary | ICD-10-CM | POA: Diagnosis present

## 2020-11-08 DIAGNOSIS — Z23 Encounter for immunization: Secondary | ICD-10-CM

## 2020-11-08 DIAGNOSIS — Z3A39 39 weeks gestation of pregnancy: Secondary | ICD-10-CM

## 2020-11-08 DIAGNOSIS — Z20822 Contact with and (suspected) exposure to covid-19: Secondary | ICD-10-CM | POA: Diagnosis present

## 2020-11-08 DIAGNOSIS — O4290 Premature rupture of membranes, unspecified as to length of time between rupture and onset of labor, unspecified weeks of gestation: Secondary | ICD-10-CM | POA: Diagnosis present

## 2020-11-08 DIAGNOSIS — O4292 Full-term premature rupture of membranes, unspecified as to length of time between rupture and onset of labor: Principal | ICD-10-CM | POA: Diagnosis present

## 2020-11-08 LAB — POCT FERN TEST: POCT Fern Test: POSITIVE

## 2020-11-08 NOTE — MAU Note (Signed)
Pt reports leaking fluid since 2200, denies pain.

## 2020-11-08 NOTE — H&P (Signed)
OBSTETRIC ADMISSION HISTORY AND PHYSICAL  Rachael Baker is a 40 y.o. female [phone] with IUP at [redacted]w[redacted]d by LMP c/w 12 wk [place] presenting for PROM. Reports gush of fluid at 10 pm. Denies feeling any contractions subsequently, but does endorse continued leakage. She reports +FMs, no VB, no blurry vision, headaches or peripheral edema, and RUQ pain.  She plans on breast feeding. She request POP's for birth control. She received her prenatal care at [facility]   Dating: By LMP/12 Wk [place] --->  Estimated Date of Delivery: 11/12/20  Sono:    @[redacted]w[redacted]d , CWD, normal anatomy, breech presentation, 788g, 38% EFW   Prenatal History/Complications:  -AMA -GBS Positive   Past Medical History: Past Medical History:  Diagnosis Date  . Asthma     Past Surgical History: Past Surgical History:  Procedure Laterality Date  . NO PAST SURGERIES      Obstetrical History: OB History    Gravida  4   Para  3   Term  3   Preterm      AB      Living  3     SAB      IAB      Ectopic      Multiple      Live Births  3           Social History Social History   Socioeconomic History  . Marital status: Married    Spouse name: Not on file  . Number of children: 3  . Years of education: Not on file  . Highest education level: Not on file  Occupational History  . Not on file  Tobacco Use  . Smoking status: Never Smoker  . Smokeless tobacco: Never Used  Vaping Use  . Vaping Use: Never used  Substance and Sexual Activity  . Alcohol use: No  . Drug use: No  . Sexual activity: Yes    Birth control/protection: None  Other Topics Concern  . Not on file  Social History Narrative  . Not on file   Social Determinants of Health   Financial Resource Strain: Not on file  Food Insecurity: Not on file  Transportation Needs: Not on file  Physical Activity: Not on file  Stress: Not on file  Social Connections: Not on file    Family History: Family History  Problem Relation Age of Onset   . Cancer Mother   . Hypertension Mother   . Hypertension Paternal Grandmother   . Diabetes Paternal Grandmother   . Diabetes Paternal Aunt   . Asthma Maternal Grandmother   . Hypertension Maternal Grandmother     Allergies: No Known Allergies  Medications Prior to Admission  Medication Sig Dispense Refill Last Dose  . aspirin EC 81 MG tablet Take 1 tablet (81 mg total) by mouth daily. Take after 12 weeks for prevention of preeclampsia later in pregnancy 300 tablet 2 11/08/2020 at Unknown time  . omeprazole (PRILOSEC) 20 MG capsule TAKE 1 CAPSULE(20 MG) BY MOUTH DAILY 90 capsule 3 11/07/2020 at Unknown time  . Prenatal Vit-Fe Fumarate-FA (MULTIVITAMIN-PRENATAL) 27-0.8 MG TABS tablet Take 1 tablet by mouth daily at 12 noon.   11/08/2020 at Unknown time     Review of Systems   All systems reviewed and negative except as stated in HPI  Blood pressure 124/69, pulse 82, temperature 98.6 F (37 C), temperature source Oral, resp. rate 18, height 5\' 7"  (1.702 m), weight 86.2 kg, last menstrual period 02/06/2020, SpO2 100 %. General appearance:  alert, cooperative and appears stated age Lungs: normal effort  Abdomen: soft, non-tender; bowel sounds normal Extremities: Homans sign is negative, no sign of DVT Presentation: cephalic Fetal monitoring baseline 135, mod variability, pos accels, no decels  Uterine activity: irritability  Dilation: 2 Effacement (%): 50 Station: -3 Exam by:: Rachael Neighbor, RN   Prenatal labs: ABO, Rh: --/--/B POS (06/05 0031) Antibody: NEG (06/05 0031) Rubella: 1.57 (11/22 1538) RPR: Non Reactive (03/21 0942)  HBsAg: Negative (11/22 1538)  HIV: Non Reactive (03/21 0942)  GBS: Positive/-- (11/26 0000)  2 hr Glucola normal  Genetic screening  Low Risk Female  Anatomy US Normal   Prenatal Transfer Tool  Maternal Diabetes: No Genetic Screening: Normal Maternal Ultrasounds/Referrals: Normal Fetal Ultrasounds or other Referrals:  MFM for AMA  Maternal  Substance Abuse:  No Significant Maternal Medications:  None Significant Maternal Lab Results: Group B Strep positive  Results for orders placed or performed during the hospital encounter of 11/08/20 (from the past 24 hour(s))  POCT fern test   Collection Time: 11/08/20 11:35 PM  Result Value Ref Range   POCT Fern Test Positive = ruptured amniotic membanes   Resp Panel by RT-PCR (Flu A&B, Covid) Nasopharyngeal Swab   Collection Time: 11/09/20 12:31 AM   Specimen: Nasopharyngeal Swab; Nasopharyngeal(NP) swabs in vial transport medium  Result Value Ref Range   SARS Coronavirus 2 by RT PCR NEGATIVE NEGATIVE   Influenza A by PCR NEGATIVE NEGATIVE   Influenza B by PCR NEGATIVE NEGATIVE  Type and screen [facility]   Collection Time: 11/09/20 12:31 AM  Result Value Ref Range   ABO/RH(D) B POS    Antibody Screen NEG    Sample Expiration      11/12/2020,2359 Performed at [facility], [address], [place], [place] [address]   CBC   Collection Time: 11/09/20 12:37 AM  Result Value Ref Range   WBC 12.6 (H) 4.0 - 10.5 K/uL   RBC 3.84 (L) 3.87 - 5.11 MIL/uL   Hemoglobin 13.0 12.0 - 15.0 g/dL   HCT 30.0 92.3 - 30.0 %   MCV 97.4 80.0 - 100.0 fL   MCH 33.9 26.0 - 34.0 pg   MCHC 34.8 30.0 - 36.0 g/dL   RDW 76.2 26.3 - 33.5 %   Platelets 178 150 - 400 K/uL   nRBC 0.0 0.0 - 0.2 %    Patient Active Problem List   Diagnosis Date Noted  . PROM (premature rupture of membranes) 11/09/2020  . Group B streptococcal bacteriuria 05/03/2020  . Encounter for supervision of high-risk pregnancy with multigravida of advanced maternal age 62/05/2020    Assessment/Plan:  Rachael Baker is a 40 y.o. G4P3003 at [redacted]w[redacted]d here for PROM. Plan to admit to L&D.   #Labor/PROM: start with cytotec and reevaluate  #Pain: Per patient request #FWB: Cat I #ID:  GBS pos, will treat w PCN  #MOF: breast #MOC: leaning towards IUD, would want it outpatient. Will discuss.  #Circ:  N/a    Gita Kudo, MD  11/09/2020, 3:09 AM

## 2020-11-09 ENCOUNTER — Other Ambulatory Visit: Payer: Self-pay

## 2020-11-09 ENCOUNTER — Inpatient Hospital Stay (HOSPITAL_COMMUNITY): Payer: Managed Care, Other (non HMO) | Admitting: Anesthesiology

## 2020-11-09 ENCOUNTER — Encounter (HOSPITAL_COMMUNITY): Payer: Self-pay | Admitting: Obstetrics and Gynecology

## 2020-11-09 DIAGNOSIS — O42913 Preterm premature rupture of membranes, unspecified as to length of time between rupture and onset of labor, third trimester: Secondary | ICD-10-CM

## 2020-11-09 DIAGNOSIS — O4290 Premature rupture of membranes, unspecified as to length of time between rupture and onset of labor, unspecified weeks of gestation: Secondary | ICD-10-CM | POA: Diagnosis present

## 2020-11-09 DIAGNOSIS — Z23 Encounter for immunization: Secondary | ICD-10-CM | POA: Diagnosis not present

## 2020-11-09 DIAGNOSIS — O321XX0 Maternal care for breech presentation, not applicable or unspecified: Secondary | ICD-10-CM | POA: Diagnosis present

## 2020-11-09 DIAGNOSIS — Z3A39 39 weeks gestation of pregnancy: Secondary | ICD-10-CM

## 2020-11-09 DIAGNOSIS — Z20822 Contact with and (suspected) exposure to covid-19: Secondary | ICD-10-CM | POA: Diagnosis present

## 2020-11-09 DIAGNOSIS — O99824 Streptococcus B carrier state complicating childbirth: Secondary | ICD-10-CM

## 2020-11-09 DIAGNOSIS — O326XX0 Maternal care for compound presentation, not applicable or unspecified: Secondary | ICD-10-CM

## 2020-11-09 DIAGNOSIS — O4292 Full-term premature rupture of membranes, unspecified as to length of time between rupture and onset of labor: Secondary | ICD-10-CM | POA: Diagnosis present

## 2020-11-09 LAB — CBC
HCT: 37.4 % (ref 36.0–46.0)
Hemoglobin: 13 g/dL (ref 12.0–15.0)
MCH: 33.9 pg (ref 26.0–34.0)
MCHC: 34.8 g/dL (ref 30.0–36.0)
MCV: 97.4 fL (ref 80.0–100.0)
Platelets: 178 10*3/uL (ref 150–400)
RBC: 3.84 MIL/uL — ABNORMAL LOW (ref 3.87–5.11)
RDW: 13.5 % (ref 11.5–15.5)
WBC: 12.6 10*3/uL — ABNORMAL HIGH (ref 4.0–10.5)
nRBC: 0 % (ref 0.0–0.2)

## 2020-11-09 LAB — RESP PANEL BY RT-PCR (FLU A&B, COVID) ARPGX2
Influenza A by PCR: NEGATIVE
Influenza B by PCR: NEGATIVE
SARS Coronavirus 2 by RT PCR: NEGATIVE

## 2020-11-09 LAB — RPR: RPR Ser Ql: NONREACTIVE

## 2020-11-09 LAB — TYPE AND SCREEN
ABO/RH(D): B POS
Antibody Screen: NEGATIVE

## 2020-11-09 MED ORDER — OXYTOCIN-SODIUM CHLORIDE 30-0.9 UT/500ML-% IV SOLN
2.5000 [IU]/h | INTRAVENOUS | Status: DC
  Administered 2020-11-09: 2.5 [IU]/h via INTRAVENOUS
  Filled 2020-11-09: qty 500

## 2020-11-09 MED ORDER — LACTATED RINGERS IV SOLN: 500.0000 mL | Freq: Once | INTRAVENOUS | Status: DC

## 2020-11-09 MED ORDER — MISOPROSTOL 50MCG HALF TABLET
50.0000 ug | ORAL_TABLET | ORAL | Status: DC | PRN
  Administered 2020-11-09: 50 ug via VAGINAL
  Filled 2020-11-09: qty 1

## 2020-11-09 MED ORDER — ZOLPIDEM TARTRATE 5 MG PO TABS: 5.0000 mg | ORAL_TABLET | Freq: Every evening | ORAL | Status: DC | PRN

## 2020-11-09 MED ORDER — DIPHENHYDRAMINE HCL 50 MG/ML IJ SOLN: 12.5000 mg | INTRAMUSCULAR | Status: DC | PRN

## 2020-11-09 MED ORDER — ONDANSETRON HCL 4 MG PO TABS: 4.0000 mg | ORAL_TABLET | ORAL | Status: DC | PRN

## 2020-11-09 MED ORDER — WITCH HAZEL-GLYCERIN EX PADS: 1.0000 "application " | MEDICATED_PAD | CUTANEOUS | Status: DC | PRN

## 2020-11-09 MED ORDER — EPHEDRINE 5 MG/ML INJ: 10.0000 mg | INTRAVENOUS | Status: DC | PRN

## 2020-11-09 MED ORDER — LIDOCAINE-EPINEPHRINE (PF) 2 %-1:200000 IJ SOLN
INTRAMUSCULAR | Status: DC | PRN
  Administered 2020-11-09: 2 mL via EPIDURAL
  Administered 2020-11-09: 5 mL via EPIDURAL

## 2020-11-09 MED ORDER — LACTATED RINGERS IV SOLN: 500.0000 mL | INTRAVENOUS | Status: DC | PRN

## 2020-11-09 MED ORDER — FENTANYL CITRATE (PF) 100 MCG/2ML IJ SOLN: 50.0000 ug | INTRAMUSCULAR | Status: DC | PRN

## 2020-11-09 MED ORDER — DIPHENHYDRAMINE HCL 25 MG PO CAPS: 25.0000 mg | ORAL_CAPSULE | Freq: Four times a day (QID) | ORAL | Status: DC | PRN

## 2020-11-09 MED ORDER — PHENYLEPHRINE 40 MCG/ML (10ML) SYRINGE FOR IV PUSH (FOR BLOOD PRESSURE SUPPORT): 80.0000 ug | PREFILLED_SYRINGE | INTRAVENOUS | Status: DC | PRN

## 2020-11-09 MED ORDER — SOD CITRATE-CITRIC ACID 500-334 MG/5ML PO SOLN: 30.0000 mL | ORAL | Status: DC | PRN

## 2020-11-09 MED ORDER — LACTATED RINGERS IV SOLN: INTRAVENOUS | Status: DC

## 2020-11-09 MED ORDER — ONDANSETRON HCL 4 MG/2ML IJ SOLN: 4.0000 mg | Freq: Four times a day (QID) | INTRAMUSCULAR | Status: DC | PRN

## 2020-11-09 MED ORDER — SIMETHICONE 80 MG PO CHEW: 80.0000 mg | CHEWABLE_TABLET | ORAL | Status: DC | PRN

## 2020-11-09 MED ORDER — PRENATAL MULTIVITAMIN CH
1.0000 | ORAL_TABLET | Freq: Every day | ORAL | Status: DC
  Administered 2020-11-09 – 2020-11-10 (×2): 1 via ORAL
  Filled 2020-11-09 (×2): qty 1

## 2020-11-09 MED ORDER — SENNOSIDES-DOCUSATE SODIUM 8.6-50 MG PO TABS
2.0000 | ORAL_TABLET | Freq: Every day | ORAL | Status: DC
  Administered 2020-11-10: 2 via ORAL
  Filled 2020-11-09: qty 2

## 2020-11-09 MED ORDER — PENICILLIN G POT IN DEXTROSE 60000 UNIT/ML IV SOLN
3.0000 10*6.[IU] | INTRAVENOUS | Status: DC
  Administered 2020-11-09: 3 10*6.[IU] via INTRAVENOUS
  Filled 2020-11-09 (×2): qty 50

## 2020-11-09 MED ORDER — OXYTOCIN BOLUS FROM INFUSION
333.0000 mL | Freq: Once | INTRAVENOUS | Status: AC
  Administered 2020-11-09: 333 mL via INTRAVENOUS

## 2020-11-09 MED ORDER — DIBUCAINE (PERIANAL) 1 % EX OINT: 1.0000 "application " | TOPICAL_OINTMENT | CUTANEOUS | Status: DC | PRN

## 2020-11-09 MED ORDER — ACETAMINOPHEN 325 MG PO TABS
650.0000 mg | ORAL_TABLET | ORAL | Status: DC | PRN
  Administered 2020-11-10: 650 mg via ORAL
  Filled 2020-11-09: qty 2

## 2020-11-09 MED ORDER — IBUPROFEN 600 MG PO TABS
600.0000 mg | ORAL_TABLET | Freq: Four times a day (QID) | ORAL | Status: DC
  Administered 2020-11-09 – 2020-11-10 (×5): 600 mg via ORAL
  Filled 2020-11-09 (×5): qty 1

## 2020-11-09 MED ORDER — LIDOCAINE HCL (PF) 1 % IJ SOLN: 30.0000 mL | INTRAMUSCULAR | Status: DC | PRN

## 2020-11-09 MED ORDER — SODIUM CHLORIDE 0.9 % IV SOLN
5.0000 10*6.[IU] | Freq: Once | INTRAVENOUS | Status: AC
  Administered 2020-11-09: 5 10*6.[IU] via INTRAVENOUS
  Filled 2020-11-09: qty 5

## 2020-11-09 MED ORDER — COCONUT OIL OIL: 1.0000 "application " | TOPICAL_OIL | Status: DC | PRN

## 2020-11-09 MED ORDER — ONDANSETRON HCL 4 MG/2ML IJ SOLN: 4.0000 mg | INTRAMUSCULAR | Status: DC | PRN

## 2020-11-09 MED ORDER — FENTANYL-BUPIVACAINE-NACL 0.5-0.125-0.9 MG/250ML-% EP SOLN
12.0000 mL/h | EPIDURAL | Status: DC | PRN
  Administered 2020-11-09: 12 mL/h via EPIDURAL
  Filled 2020-11-09: qty 250

## 2020-11-09 MED ORDER — ACETAMINOPHEN 325 MG PO TABS: 650.0000 mg | ORAL_TABLET | ORAL | Status: DC | PRN

## 2020-11-09 MED ORDER — TETANUS-DIPHTH-ACELL PERTUSSIS 5-2.5-18.5 LF-MCG/0.5 IM SUSY
0.5000 mL | PREFILLED_SYRINGE | Freq: Once | INTRAMUSCULAR | Status: AC
  Administered 2020-11-10: 0.5 mL via INTRAMUSCULAR
  Filled 2020-11-09: qty 0.5

## 2020-11-09 MED ORDER — BENZOCAINE-MENTHOL 20-0.5 % EX AERO
1.0000 | INHALATION_SPRAY | CUTANEOUS | Status: DC | PRN
Start: 2020-11-09 — End: 2020-11-10

## 2020-11-09 NOTE — Discharge Summary (Signed)
Postpartum Discharge Summary     Patient Name: Rachael Baker DOB: 28-Oct-1980 MRN: [id number]  Date of admission: 11/08/2020 Delivery date:11/09/2020  Delivering provider: Janet Berlin  Date of discharge: 11/10/2020  Admitting diagnosis: PROM (premature rupture of membranes) [O42.90] Intrauterine pregnancy: [redacted]w[redacted]d    Secondary diagnosis:  Principal Problem:   Vaginal delivery Active Problems:   Group B streptococcal bacteriuria   PROM (premature rupture of membranes)  Additional problems: as noted above  Discharge diagnosis: Term Pregnancy Delivered                                              Post partum procedures:none Augmentation: Cytotec Complications: None  Hospital course: Induction of Labor With Vaginal Delivery   40y.o. yo [id number] admitted to the hospital 11/08/2020 for induction of labor.  Indication for induction: PROM.  Patient had an uncomplicated labor course as follows: Membrane Rupture Time/Date: 10:00 PM ,11/08/2020   Delivery Method:Vaginal, Spontaneous  Episiotomy: None  Lacerations:  None  Details of delivery can be found in separate delivery note.  Patient had a routine postpartum course. Patient is discharged home 11/10/20.  Newborn Data: Birth date:11/09/2020  Birth time:6:08 AM  Gender:Female  Living status:Living  Apgars:9 ,9  Weight:3175 g   Magnesium Sulfate received: No BMZ received: No Rhophylac:N/A MMR:N/A T-DaP:declined prenatal Flu: No Transfusion:No  Physical exam  Vitals:   11/09/20 0945 11/09/20 1352 11/09/20 1700 11/09/20 2310  BP: (!) 119/59 118/72 123/68 110/69  Pulse: 75 86 73 79  Resp: '18 18 18 17  ' Temp: 98.8 F (37.1 C) 97.7 F (36.5 C) 98.8 F (37.1 C) 98.3 F (36.8 C)  TempSrc: Oral Oral Oral Oral  SpO2:   100% 98%  Weight:      Height:       General: alert, cooperative and no distress Lochia: appropriate Uterine Fundus: firm Incision: N/A DVT Evaluation: No evidence of DVT seen on physical  exam. No cords or calf tenderness. No significant calf/ankle edema. Labs: Lab Results  Component Value Date   WBC 12.6 (H) 11/09/2020   HGB 13.0 11/09/2020   HCT 37.4 11/09/2020   MCV 97.4 11/09/2020   PLT 178 11/09/2020   No flowsheet data found. Edinburgh Score: Edinburgh Postnatal Depression Scale Screening Tool 11/09/2020  I have been able to laugh and see the funny side of things. 0  I have looked forward with enjoyment to things. 0  I have blamed myself unnecessarily when things went wrong. 0  I have been anxious or worried for no good reason. 0  I have felt scared or panicky for no good reason. 0  Things have been getting on top of me. 0  I have been so unhappy that I have had difficulty sleeping. 0  I have felt sad or miserable. 0  I have been so unhappy that I have been crying. 0  The thought of harming myself has occurred to me. 0  Edinburgh Postnatal Depression Scale Total 0     After visit meds:  Allergies as of 11/10/2020   No Known Allergies     Medication List    STOP taking these medications   aspirin EC 81 MG tablet   omeprazole 20 MG capsule Commonly known as: PRILOSEC     TAKE these medications   acetaminophen 325 MG tablet Commonly  known as: Tylenol Take 2 tablets (650 mg total) by mouth every 6 (six) hours as needed for mild pain, moderate pain, fever or headache (for pain scale < 4).   coconut oil Oil Apply 1 application topically as needed (nipple pain).   ibuprofen 600 MG tablet Commonly known as: ADVIL Take 1 tablet (600 mg total) by mouth every 8 (eight) hours as needed for moderate pain or cramping.   multivitamin-prenatal 27-0.8 MG Tabs tablet Take 1 tablet by mouth daily at 12 noon.        Discharge home in stable condition Infant Feeding: Breast Infant Disposition:home with mother Discharge instruction: per After Visit Summary and Postpartum booklet. Activity: Advance as tolerated. Pelvic rest for 6 weeks.  Diet: routine  diet Future Appointments: Future Appointments  Date Time Provider [place]  12/09/2020  2:00 PM Griffin Basil, MD [place] None   Follow up Visit:   Please schedule this patient for a In person postpartum visit in 4 weeks with the following provider: Any provider. Additional Postpartum F/U:none  Low risk pregnancy complicated by: uncomplicated Delivery mode:  Vaginal, Spontaneous  Anticipated Birth Control:  Unsure, leaning towards IUD   Tawnia Schirm, Placido Sou, MD OB Fellow, [facility] 11/10/2020 11:04 AM

## 2020-11-09 NOTE — Progress Notes (Signed)
Pt informed that the ultrasound is considered a limited OB ultrasound and is not intended to be a complete ultrasound exam.  Patient also informed that the ultrasound is not being completed with the intent of assessing for fetal or placental anomalies or any pelvic abnormalities.  Explained that the purpose of today's ultrasound is to assess for  presentation.  Patient acknowledges the purpose of the exam and the limitations of the study.    [facility]  Thressa Sheller DNP, CNM  11/09/20  12:14 AM

## 2020-11-09 NOTE — Anesthesia Procedure Notes (Signed)
Epidural Patient location during procedure: OB Start time: 11/09/2020 3:38 AM End time: 11/09/2020 3:50 AM  Staffing Anesthesiologist: Lucretia Kern, MD Performed: anesthesiologist   Preanesthetic Checklist Completed: patient identified, IV checked, risks and benefits discussed, monitors and equipment checked, pre-op evaluation and timeout performed  Epidural Patient position: sitting Prep: DuraPrep Patient monitoring: heart rate, continuous pulse ox and blood pressure Approach: midline Location: L3-L4 Injection technique: LOR air  Needle:  Needle type: Tuohy  Needle gauge: 17 G Needle length: 9 cm Needle insertion depth: 6 cm Catheter type: closed end flexible Catheter size: 20 Guage Catheter at skin depth: 11 cm Test dose: negative and 2% lidocaine with Epi 1:200 K  Assessment Events: blood not aspirated, injection not painful, no injection resistance, no paresthesia and negative IV test  Additional Notes Reason for block:procedure for pain

## 2020-11-09 NOTE — Anesthesia Preprocedure Evaluation (Signed)
Anesthesia Evaluation  Patient identified by MRN, date of birth, ID band Patient awake    Reviewed: Allergy & Precautions, H&P , NPO status , Patient's Chart, lab work & pertinent test results  History of Anesthesia Complications Negative for: history of anesthetic complications  Airway Mallampati: II  TM Distance: >3 FB Neck ROM: full    Dental no notable dental hx.    Pulmonary asthma ,    Pulmonary exam normal        Cardiovascular negative cardio ROS Normal cardiovascular exam Rhythm:regular Rate:Normal     Neuro/Psych negative neurological ROS  negative psych ROS   GI/Hepatic negative GI ROS, Neg liver ROS,   Endo/Other  negative endocrine ROS  Renal/GU negative Renal ROS  negative genitourinary   Musculoskeletal   Abdominal   Peds  Hematology negative hematology ROS (+)   Anesthesia Other Findings   Reproductive/Obstetrics (+) Pregnancy                             Anesthesia Physical Anesthesia Plan  ASA: II  Anesthesia Plan: Epidural   Post-op Pain Management:    Induction:   PONV Risk Score and Plan:   Airway Management Planned:   Additional Equipment:   Intra-op Plan:   Post-operative Plan:   Informed Consent: I have reviewed the patients History and Physical, chart, labs and discussed the procedure including the risks, benefits and alternatives for the proposed anesthesia with the patient or authorized representative who has indicated his/her understanding and acceptance.       Plan Discussed with:   Anesthesia Plan Comments:         Anesthesia Quick Evaluation  

## 2020-11-09 NOTE — Anesthesia Postprocedure Evaluation (Signed)
Anesthesia Post Note  Patient: Rachael Baker  Procedure(s) Performed: AN AD HOC LABOR EPIDURAL     Patient location during evaluation: Mother Baby Anesthesia Type: Epidural Level of consciousness: awake and alert Pain management: pain level controlled Vital Signs Assessment: post-procedure vital signs reviewed and stable Respiratory status: spontaneous breathing, nonlabored ventilation and respiratory function stable Cardiovascular status: stable Postop Assessment: no headache, no backache, epidural receding, no apparent nausea or vomiting, patient able to bend at knees, adequate PO intake and able to ambulate Anesthetic complications: no   No complications documented.  Last Vitals:  Vitals:   11/09/20 1352 11/09/20 1700  BP: 118/72 123/68  Pulse: 86 73  Resp: 18 18  Temp: 36.5 C 37.1 C  SpO2:  100%    Last Pain:  Vitals:   11/09/20 1700  TempSrc: Oral  PainSc:    Pain Goal:                   [place] [employer]

## 2020-11-10 MED ORDER — COCONUT OIL OIL: 1.0000 "application " | TOPICAL_OIL | 0 refills | Status: DC | PRN

## 2020-11-10 MED ORDER — ACETAMINOPHEN 325 MG PO TABS: 650.0000 mg | ORAL_TABLET | Freq: Four times a day (QID) | ORAL | Status: DC | PRN

## 2020-11-10 MED ORDER — IBUPROFEN 600 MG PO TABS: 600.0000 mg | ORAL_TABLET | Freq: Three times a day (TID) | ORAL | 0 refills | Status: DC | PRN

## 2020-11-10 NOTE — Discharge Instructions (Signed)

## 2020-11-12 ENCOUNTER — Encounter: Payer: Managed Care, Other (non HMO) | Admitting: Women's Health

## 2020-11-12 ENCOUNTER — Encounter: Payer: Managed Care, Other (non HMO) | Admitting: Obstetrics

## 2020-11-19 ENCOUNTER — Inpatient Hospital Stay (HOSPITAL_COMMUNITY)
Admission: AD | Admit: 2020-11-19 | Payer: Managed Care, Other (non HMO) | Source: Home / Self Care | Admitting: Obstetrics

## 2020-11-19 ENCOUNTER — Inpatient Hospital Stay (HOSPITAL_COMMUNITY): Payer: Managed Care, Other (non HMO)

## 2020-11-25 ENCOUNTER — Telehealth: Payer: Self-pay | Admitting: *Deleted

## 2020-11-25 NOTE — Telephone Encounter (Signed)
Received call from [facility] nurse regarding pt BP readings at today's visit.  BP readings were 130's/90's,  denies any neuro symptoms.   Attempt to contact pt to schedule nurse visit for BP check. No answer, left detail message making pt aware that provider would like her seen in office for BP check this week.  Advised to call office.   Placed call to [facility], family connect nurse, made her aware of attempt to contact pt.  She states she will be following up with pt and make her aware of appt need as well.

## 2020-11-28 ENCOUNTER — Ambulatory Visit (INDEPENDENT_AMBULATORY_CARE_PROVIDER_SITE_OTHER): Payer: Managed Care, Other (non HMO)

## 2020-11-28 ENCOUNTER — Other Ambulatory Visit: Payer: Self-pay

## 2020-11-28 VITALS — BP 141/89 | HR 103 | Ht 67.0 in | Wt 174.0 lb

## 2020-11-28 DIAGNOSIS — R030 Elevated blood-pressure reading, without diagnosis of hypertension: Secondary | ICD-10-CM

## 2020-11-28 MED ORDER — AMLODIPINE BESYLATE 10 MG PO TABS: 10.0000 mg | ORAL_TABLET | Freq: Every day | ORAL | 1 refills | Status: AC

## 2020-11-28 NOTE — Progress Notes (Signed)
Subjective:  Rachael Baker is a 40 y.o. female here for BP check.   Hypertension ROS: Pt is not currently on medications. No HA, no blurred vision, no visual changes, no TIA's, no chest pain on exertion, no dyspnea on exertion, and no swelling of ankles.    Objective:  BP (!) 151/103 (BP Location: Right Arm, Patient Position: Sitting, Cuff Size: Normal)   Pulse (!) 103   Ht 5\' 7"  (1.702 m)   Wt 174 lb (78.9 kg)   LMP 02/06/2020   Breastfeeding No   BMI 27.25 kg/m   Appearance alert, well appearing, and in no distress. General exam BP noted to be well controlled today in office.    Assessment:   Blood Pressure needs further observation.   Plan:  Orders and follow up as documented in patient record. Per Dr. 04/07/2020, pt will start Amlodipine 10mg  1 po qd. Pt to return on Tuesday 12/02/20 for follow up blood pressure check after starting medication. Rx sent to patient's preferred pharmacy.

## 2020-12-02 ENCOUNTER — Other Ambulatory Visit: Payer: Self-pay

## 2020-12-02 ENCOUNTER — Ambulatory Visit (INDEPENDENT_AMBULATORY_CARE_PROVIDER_SITE_OTHER): Payer: Managed Care, Other (non HMO)

## 2020-12-02 DIAGNOSIS — R030 Elevated blood-pressure reading, without diagnosis of hypertension: Secondary | ICD-10-CM

## 2020-12-02 NOTE — Progress Notes (Addendum)
Subjective:  Rachael Baker is a 40 y.o. female here for BP check.   Hypertension ROS: taking medications as instructed, no medication side effects noted, no TIA's, no chest pain on exertion, no dyspnea on exertion, and no swelling of ankles. Denies HA, visual changes, blurred vision.   Objective:  BP (!) 147/87 (BP Location: Right Arm, Patient Position: Sitting, Cuff Size: Normal)   Pulse (!) 118   Ht 5\' 7"  (1.702 m)   Wt 170 lb (77.1 kg)   LMP 02/06/2020   Breastfeeding No   BMI 26.63 kg/m  Repeat BP 151/89 in left arm Appearance alert, well appearing, and in no distress. General exam BP noted to be well controlled today in office.    Assessment:   Blood Pressure stable.   Plan:  Pt to continue on amlodipine 10mg . Will follow up on July 5 for postpartum exam. Pt given precautions for preeclampsia and to go to MAU should she develop any symptoms.  Patient was assessed and managed by nursing staff during this encounter. I have reviewed the chart and agree with the documentation and plan. I have also made any necessary editorial changes.  , MD 12/02/2020 2:22 PM

## 2020-12-09 ENCOUNTER — Other Ambulatory Visit (HOSPITAL_COMMUNITY)
Admission: RE | Admit: 2020-12-09 | Discharge: 2020-12-09 | Disposition: A | Discharge status: Home / Self Care | Payer: Managed Care, Other (non HMO) | Source: Ambulatory Visit | Attending: Obstetrics and Gynecology | Admitting: Obstetrics and Gynecology

## 2020-12-09 ENCOUNTER — Ambulatory Visit (INDEPENDENT_AMBULATORY_CARE_PROVIDER_SITE_OTHER): Payer: Managed Care, Other (non HMO) | Admitting: Obstetrics and Gynecology

## 2020-12-09 ENCOUNTER — Encounter: Payer: Self-pay | Admitting: Obstetrics and Gynecology

## 2020-12-09 ENCOUNTER — Other Ambulatory Visit: Payer: Self-pay

## 2020-12-09 DIAGNOSIS — Z01419 Encounter for gynecological examination (general) (routine) without abnormal findings: Secondary | ICD-10-CM | POA: Insufficient documentation

## 2020-12-09 DIAGNOSIS — Z803 Family history of malignant neoplasm of breast: Secondary | ICD-10-CM | POA: Insufficient documentation

## 2020-12-09 NOTE — Progress Notes (Signed)
Post Partum Visit Note  Rachael Baker is a 40 y.o. [id number] female who presents for a postpartum visit. She is 4 weeks postpartum following a normal spontaneous vaginal delivery.  I have fully reviewed the prenatal and intrapartum course. The delivery was at 39 gestational weeks.  Anesthesia: epidural. Postpartum course has been uncomplicated. Baby is doing well. Baby is feeding by bottle - Similac Total 360 . Bleeding no bleeding. Bowel function is normal. Bladder function is normal. Patient is not sexually active. Contraception method is OCP (estrogen/progesterone). Postpartum depression screening: negative.   The pregnancy intention screening data noted above was reviewed. Potential methods of contraception were discussed. The patient elected to proceed with Oral Contraceptive.    Edinburgh Postnatal Depression Scale - 12/09/20 1407       Edinburgh Postnatal Depression Scale:  In the Past 7 Days   I have been able to laugh and see the funny side of things. 0    I have looked forward with enjoyment to things. 0    I have blamed myself unnecessarily when things went wrong. 0    I have been anxious or worried for no good reason. 0    I have felt scared or panicky for no good reason. 0    Things have been getting on top of me. 0    I have been so unhappy that I have had difficulty sleeping. 0    I have felt sad or miserable. 0    I have been so unhappy that I have been crying. 0    The thought of harming myself has occurred to me. 0    Edinburgh Postnatal Depression Scale Total 0             Health Maintenance Due  Topic Date Due   COVID-19 Vaccine (3 - Booster for Pfizer series) 06/21/2020   PAP SMEAR-Modifier  01/21/2021    The following portions of the patient's history were reviewed and updated as appropriate: allergies, current medications, past family history, past medical history, past social history, past surgical history, and problem list.  Review of  Systems Pertinent items are noted in HPI.  Objective:  BP 132/88   Pulse 99   Ht 5\' 7"  (1.702 m)   Wt 170 lb 14.4 oz (77.5 kg)   LMP 02/06/2020   Breastfeeding No   BMI 26.77 kg/m    General:  alert, cooperative, and no distress   Breasts:  normal  Lungs: clear to auscultation bilaterally  Heart:  regular rate and rhythm  Abdomen: soft, non-tender; bowel sounds normal; no masses,  no organomegaly   Wound N/a  GU exam:  normal       Assessment:    Postpartum exam  normal postpartum exam.   Plan:   Essential components of care per ACOG recommendations:  1.  Mood and well being: Patient with negative depression screening today. Reviewed local resources for support.  - Patient tobacco use? No.   - hx of drug use? No.    2. Infant care and feeding:  -Patient currently breastmilk feeding? No.  -Social determinants of health (SDOH) reviewed in EPIC. No concerns.  3. Sexuality, contraception and birth spacing - Patient does not want a pregnancy in the next year.  Desired family size is 4 children.  - Reviewed forms of contraception in tiered fashion. Patient desired  nexplanon  today.   - Discussed birth spacing of 18 months  4. Sleep and fatigue -Encouraged family/partner/community support  of 4 hrs of uninterrupted sleep to help with mood and fatigue  5. Physical Recovery  - Discussed patients delivery and complications. She describes her labor as good. - Patient had a Vaginal, no problems at delivery. Patient had   no  laceration. Perineal healing reviewed. Patient expressed understanding - Patient has urinary incontinence? No. - Patient is safe to resume physical and sexual activity  6.  Health Maintenance - HM due items addressed Yes - Last pap smear normal 01/21/18.  Pap smear done at today's visit.  -Breast Cancer screening indicated? Yes, per pt her mother had breast CA at age 36, she has received mammograms previously and will get another 8/22  7. Chronic  Disease/Pregnancy Condition follow up: Hypertension and family hx of breast CA.   Continue amlodipine and recheck BP at nexplanon placement in 2 weeks  Due to early breast CA with mother, pt referred to genetic counseling.  - PCP follow up  Nexplanon placement in 2 weeks.  Warden Fillers, MD Center for [employer], [facility]

## 2020-12-10 ENCOUNTER — Telehealth: Payer: Self-pay | Admitting: Licensed Clinical Social Worker

## 2020-12-10 NOTE — Telephone Encounter (Signed)
Received a genetic counseling referral from Dr. Donavan Foil for fhx of breast cancer. Rachael Baker has been cld and scheduled to see [name] on 7/25 at 2pm. Pt aware to arrive 15 minutes early.

## 2020-12-11 LAB — CYTOLOGY - PAP
Comment: NEGATIVE
Diagnosis: NEGATIVE
High risk HPV: NEGATIVE

## 2020-12-20 ENCOUNTER — Other Ambulatory Visit: Payer: Self-pay | Admitting: Family Medicine

## 2020-12-20 DIAGNOSIS — R030 Elevated blood-pressure reading, without diagnosis of hypertension: Secondary | ICD-10-CM

## 2020-12-25 ENCOUNTER — Ambulatory Visit (INDEPENDENT_AMBULATORY_CARE_PROVIDER_SITE_OTHER): Payer: Managed Care, Other (non HMO) | Admitting: Obstetrics

## 2020-12-25 ENCOUNTER — Encounter: Payer: Self-pay | Admitting: Obstetrics

## 2020-12-25 ENCOUNTER — Other Ambulatory Visit: Payer: Self-pay

## 2020-12-25 VITALS — BP 134/84 | HR 100 | Ht 67.0 in | Wt 168.0 lb

## 2020-12-25 DIAGNOSIS — Z30017 Encounter for initial prescription of implantable subdermal contraceptive: Secondary | ICD-10-CM | POA: Diagnosis not present

## 2020-12-25 LAB — POCT URINE PREGNANCY: Preg Test, Ur: NEGATIVE

## 2020-12-25 MED ORDER — ETONOGESTREL 68 MG ~~LOC~~ IMPL
68.0000 mg | DRUG_IMPLANT | Freq: Once | SUBCUTANEOUS | Status: AC
  Administered 2020-12-25: 68 mg via SUBCUTANEOUS

## 2020-12-25 NOTE — Progress Notes (Signed)
Nexplanon Procedure Note   PRE-OP DIAGNOSIS: Desired Long-Acting, Reversible Contraception ( LARC ) POST-OP DIAGNOSIS: Same  PROCEDURE: Nexplanon  placement Performing Provider: Bing Neighbors. Clearance Coots MD  Patient education prior to procedure, explained risk, benefits of Nexplanon, reviewed alternative options. Patient reported understanding. Gave consent to continue with procedure.   PROCEDURE:  Pregnancy Text :  Negative Site (check):      left arm         Sterile Preparation:   Betadinex3 Lot #:  [id number] Expiration Date: 2024 JUL 03  Insertion site was selected 8 - 10 cm from medial epicondyle and marked along with guiding site using sterile marker. Procedure area was prepped and draped in a sterile fashion. 1% Lidocaine 1.5 ml given prior to procedure. Nexplanon  was inserted subcutaneously.Needle was removed from the insertion site. Nexplanon capsule was palpated by provider and patient to assure satisfactory placement. Dressing applied.  Followup: The patient tolerated the procedure well without complications.  Standard post-procedure care is explained and return precautions are given.  Brock Bad, MD 12/25/2020 3:05 PM

## 2020-12-25 NOTE — Progress Notes (Signed)
RGYN patient presents for Nexplanon Insertion.

## 2020-12-29 ENCOUNTER — Inpatient Hospital Stay: Payer: Managed Care, Other (non HMO)

## 2020-12-29 ENCOUNTER — Inpatient Hospital Stay: Payer: Managed Care, Other (non HMO) | Admitting: Licensed Clinical Social Worker

## 2021-01-08 ENCOUNTER — Ambulatory Visit: Payer: Managed Care, Other (non HMO) | Admitting: Obstetrics

## 2021-01-08 ENCOUNTER — Other Ambulatory Visit: Payer: Self-pay

## 2021-01-08 ENCOUNTER — Encounter: Payer: Self-pay | Admitting: Obstetrics

## 2021-01-08 VITALS — BP 132/86 | HR 92 | Wt 167.0 lb

## 2021-01-08 DIAGNOSIS — Z3046 Encounter for surveillance of implantable subdermal contraceptive: Secondary | ICD-10-CM

## 2021-01-08 DIAGNOSIS — N946 Dysmenorrhea, unspecified: Secondary | ICD-10-CM

## 2021-01-08 MED ORDER — IBUPROFEN 800 MG PO TABS: 800.0000 mg | ORAL_TABLET | Freq: Three times a day (TID) | ORAL | 5 refills | Status: DC | PRN

## 2021-01-08 NOTE — Progress Notes (Signed)
Subjective:    Rachael Baker is a 40 y.o. female who presents for follow up after Nexplanon insertion. The patient has no complaints today. The patient is sexually active. Pertinent past medical history: none.  The information documented in the HPI was reviewed and verified.  Menstrual History: OB History     Gravida  4   Para  4   Term  4   Preterm      AB      Living  4      SAB      IAB      Ectopic      Multiple  0   Live Births  4            Patient's last menstrual period was 12/21/2020.   Patient Active Problem List   Diagnosis Date Noted   Encounter for postpartum visit 12/09/2020   Family history of breast cancer 12/09/2020   Pap smear, as part of routine gynecological examination 12/09/2020   Group B streptococcal bacteriuria 05/03/2020   Past Medical History:  Diagnosis Date   Asthma     Past Surgical History:  Procedure Laterality Date   NO PAST SURGERIES       Current Outpatient Medications:    ibuprofen (ADVIL) 800 MG tablet, Take 1 tablet (800 mg total) by mouth every 8 (eight) hours as needed., Disp: 30 tablet, Rfl: 5   acetaminophen (TYLENOL) 325 MG tablet, Take 2 tablets (650 mg total) by mouth every 6 (six) hours as needed for mild pain, moderate pain, fever or headache (for pain scale < 4)., Disp: , Rfl:    amLODipine (NORVASC) 10 MG tablet, Take 1 tablet (10 mg total) by mouth daily., Disp: 30 tablet, Rfl: 1   Prenatal Vit-Fe Fumarate-FA (MULTIVITAMIN-PRENATAL) 27-0.8 MG TABS tablet, Take 1 tablet by mouth daily at 12 noon., Disp: , Rfl:  No Known Allergies  Social History   Tobacco Use   Smoking status: Never   Smokeless tobacco: Never  Substance Use Topics   Alcohol use: No    Family History  Problem Relation Age of Onset   Cancer Mother    Hypertension Mother    Hypertension Paternal Grandmother    Diabetes Paternal Grandmother    Diabetes Paternal Aunt    Asthma Maternal Grandmother    Hypertension Maternal  Grandmother        Review of Systems Constitutional: negative for weight loss Genitourinary:negative for abnormal menstrual periods and vaginal discharge   Objective:   BP 132/86   Pulse 92   Wt 167 lb (75.8 kg)   LMP 12/21/2020   BMI 26.16 kg/m    General:   Alert and no distress  Skin:   no rash or abnormalities  Lungs:   clear to auscultation bilaterally  Heart:   regular rate and rhythm, S1, S2 normal, no murmur, click, rub or gallop             Left Upper Extremity:  Nexplanon insertion site is clean,and dry.   Rod palpated, non tender   Lab Review Urine pregnancy test Labs reviewed yes Radiologic studies reviewed no  I have spent a total of 15 minutes of face-to-face time, excluding clinical staff time, reviewing notes and preparing to see patient, ordering tests and/or medications, and counseling the patient.   Assessment:    40 y.o., continuing Nexplanon, no contraindications.   Plan:    All questions answered. Contraception: Nexplanon. Discussed healthy lifestyle modifications. Follow up as  needed. Pap smear.  1 year  Meds ordered this encounter  Medications   ibuprofen (ADVIL) 800 MG tablet    Sig: Take 1 tablet (800 mg total) by mouth every 8 (eight) hours as needed.    Dispense:  30 tablet    Refill:  5     Brock Bad, MD 01/08/2021 4:12 PM

## 2021-07-13 IMAGING — US US OB COMP LESS 14 WK
1 series · 15 of 28 positions shown · non-contrast
Comparison: None.

CLINICAL DATA: 39-year-old pregnant female with vaginal bleeding.
LMP: 02/06/2020 corresponding to an estimated gestational age of 11
weeks, 5 days.

EXAM:
OBSTETRIC <14 WK ULTRASOUND
TECHNIQUE: Transabdominal ultrasound was performed for evaluation of the
gestation as well as the maternal uterus and adnexal regions.

[Series 1: us ob comp less 14 wk · 31 acquisitions, 15 frames shown]
[im 1/31]
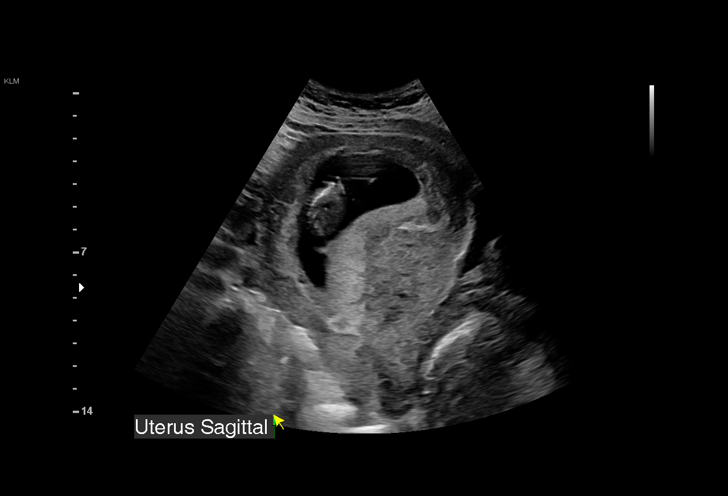
[im 3/31]
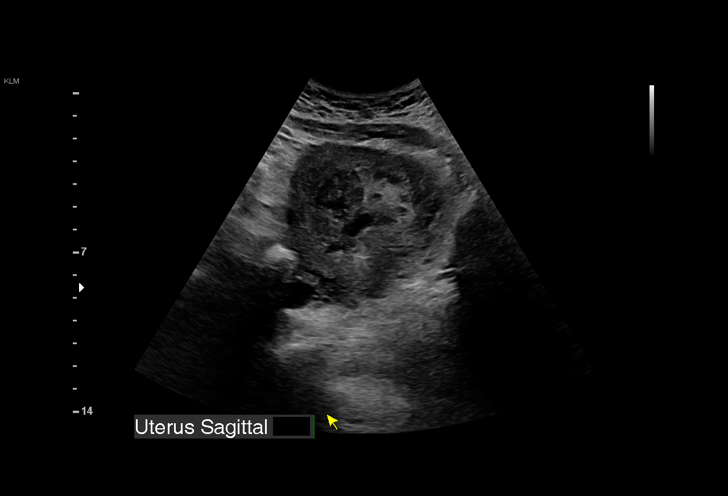
[im 5/31]
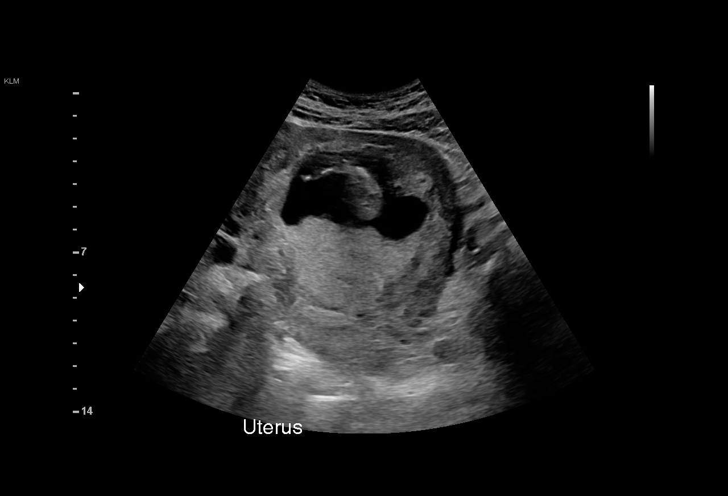
[im 7/31]
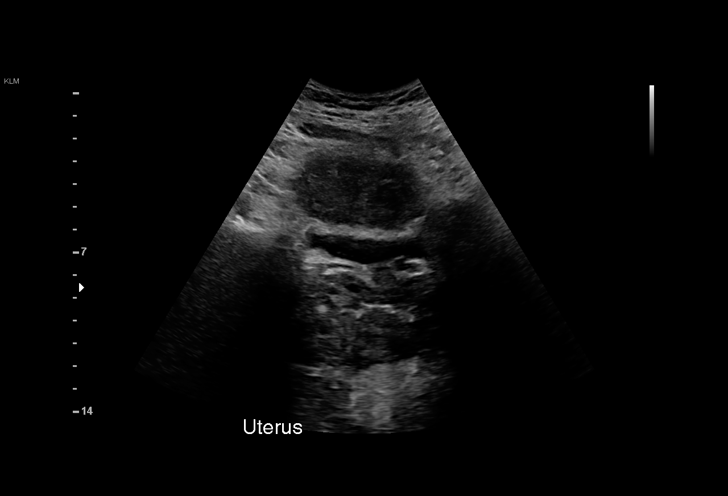
[im 9/31]
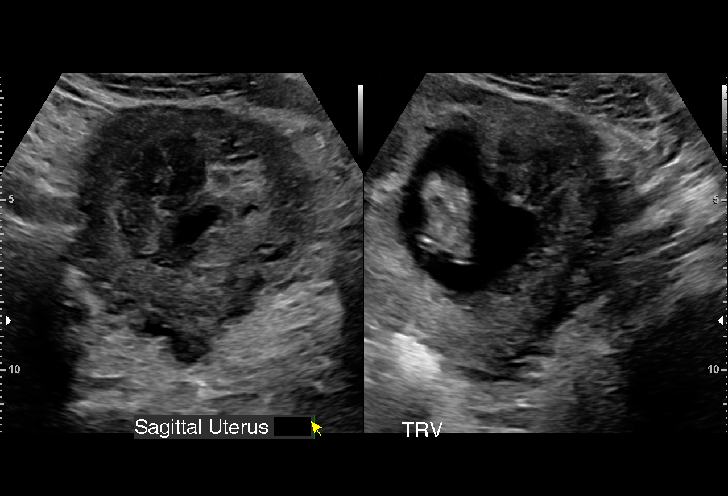
[im 12/31]
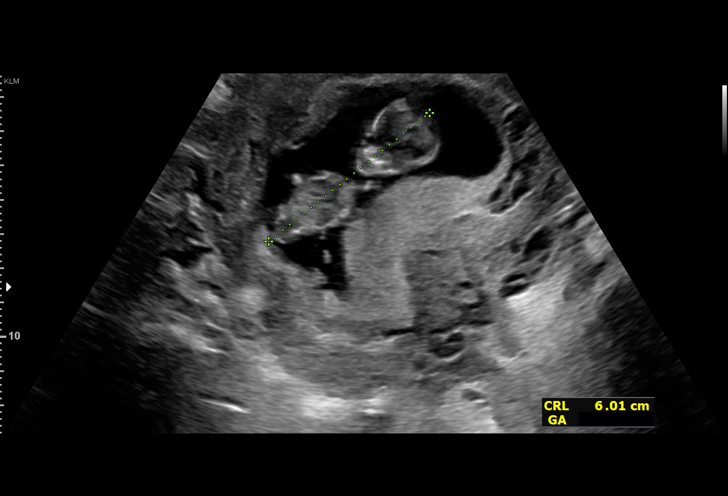
[im 14/31]
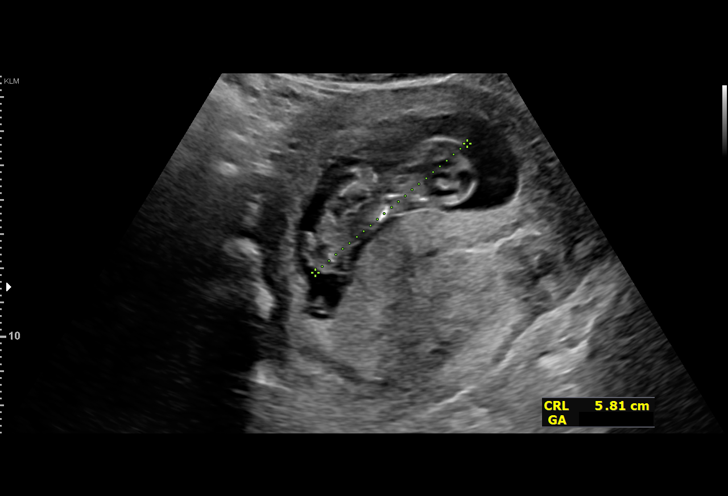
[im 16/31]
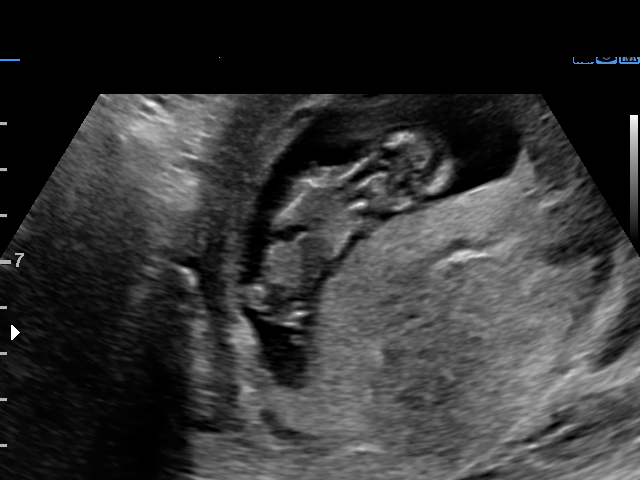
[im 17/31]
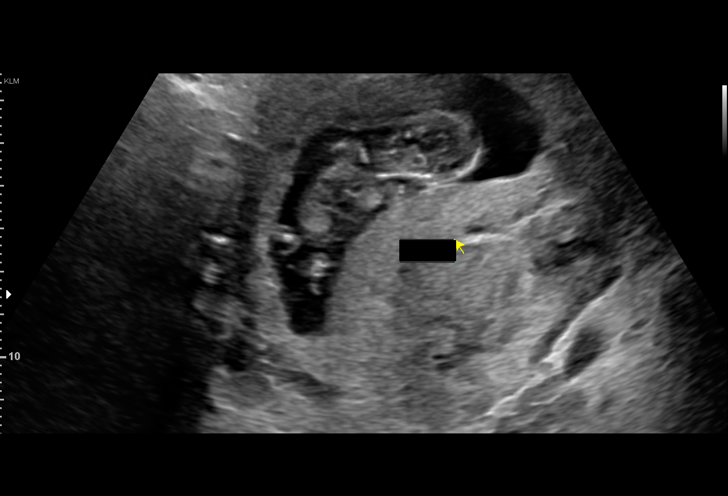
[im 19/31]
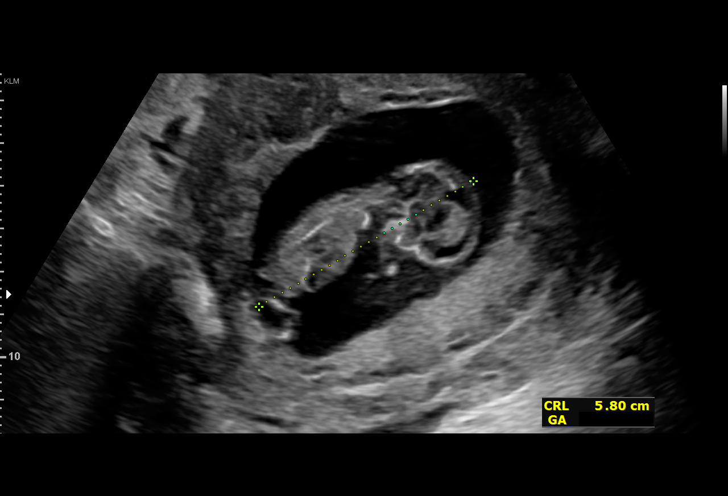
[im 22/31]
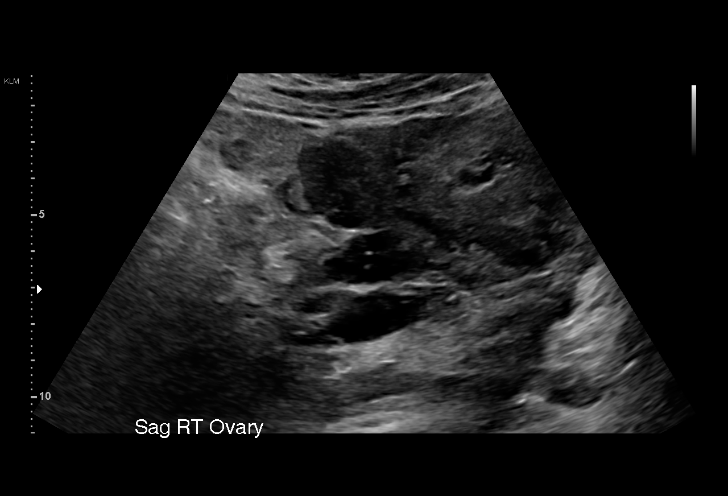
[im 24/31]
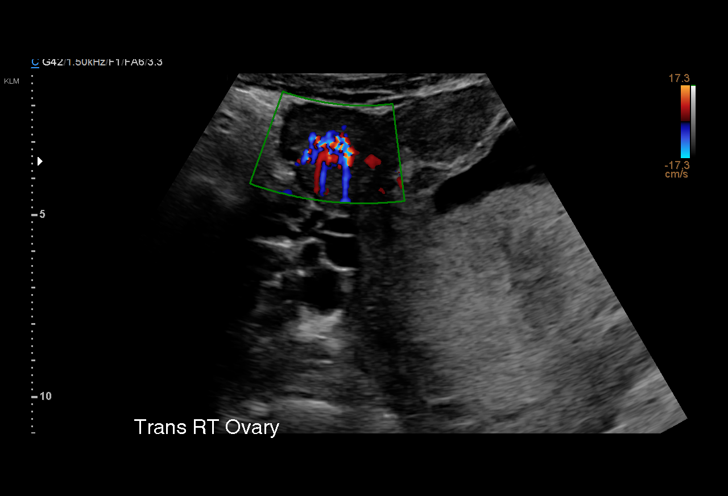
[im 26/31]
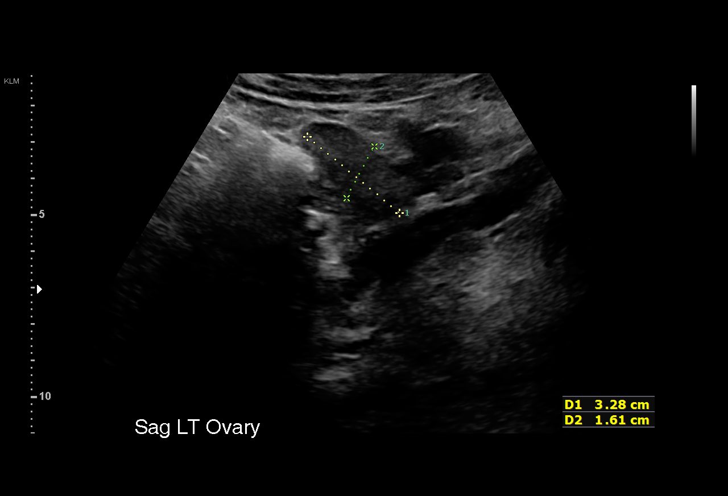
[im 28/31]
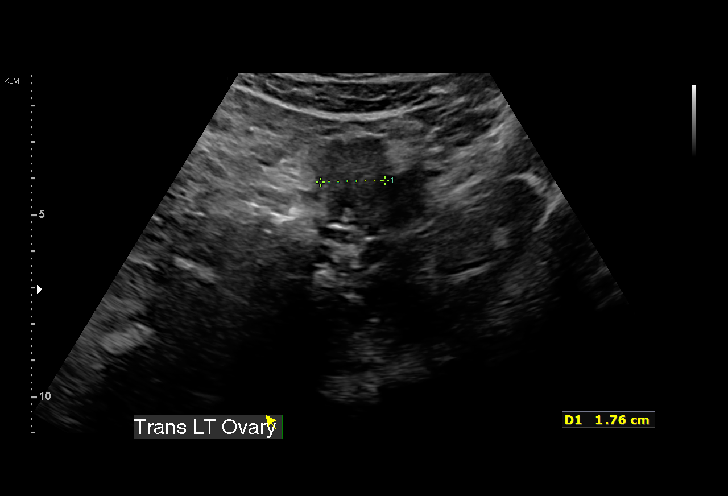
[im 31/31]
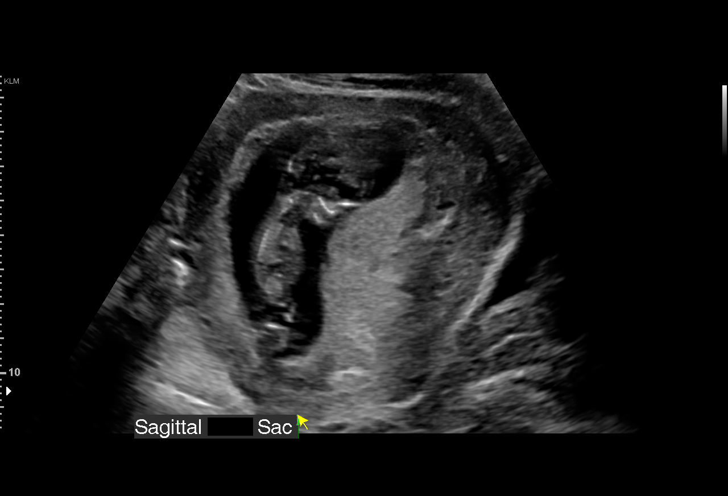

[15 of 28 positions shown; findings below may reference images not displayed]

FINDINGS: Intrauterine gestational sac: Single intrauterine gestational sac.

Yolk sac:  Not seen

Embryo:  Present

Cardiac Activity: Detected

Heart Rate: 152 bpm

CRL: 60 mm   12 w 3 d                  US EDC: 11/07/2020

Subchorionic hemorrhage:  None visualized.

Maternal uterus/adnexae: The maternal ovaries are unremarkable.
There is a 2.0 x 1.7 x 1.6 cm left anterior body submucosal fibroid
and a 2.2 x 1.6 x 1.9 cm right body intramural fibroid.
IMPRESSION: Single live intrauterine pregnancy with an estimated gestational age
of 12 weeks, 3 days.

## 2022-02-04 ENCOUNTER — Encounter: Payer: Self-pay | Admitting: Obstetrics and Gynecology

## 2022-04-01 ENCOUNTER — Ambulatory Visit: Payer: Managed Care, Other (non HMO) | Admitting: Obstetrics and Gynecology

## 2022-04-01 VITALS — BP 135/81 | HR 90 | Wt 164.0 lb

## 2022-04-01 DIAGNOSIS — Z3046 Encounter for surveillance of implantable subdermal contraceptive: Secondary | ICD-10-CM | POA: Diagnosis not present

## 2022-04-01 NOTE — Progress Notes (Signed)
     GYNECOLOGY OFFICE PROCEDURE NOTE  Rachael Baker is a 41 y.o. [id number] here for Nexplanon removal.  Last pap smear was on 12/09/20 and was normal.  No other gynecologic concerns.  Nexplanon Removal Patient identified, informed consent performed, consent signed.   Appropriate time out taken. Nexplanon site identified.  Area prepped in usual sterile fashon. One ml of 1% lidocaine was used to anesthetize the area at the distal end of the implant. A small stab incision was made right beside the implant on the distal portion.  The Nexplanon rod was grasped using hemostats and removed without difficulty.  There was minimal blood loss. There were no complications.  3 ml of 1% lidocaine was injected around the incision for post-procedure analgesia.  Steri-strips were applied over the small incision.  A pressure bandage was applied to reduce any bruising.  The patient tolerated the procedure well and was given post procedure instructions.  Patient is planning to use paragard or possibly nuvaring for contraception/due to her hx of hypertension.    Lynnda Shields, MD, [place] for [facility], [place]

## 2022-04-01 NOTE — Progress Notes (Signed)
Pt in office for Nexplanon Removal - pt having irregular, heavy cycles. Pt is interested in OCP.

## 2022-05-31 ENCOUNTER — Emergency Department (HOSPITAL_COMMUNITY)
Admission: EM | Admit: 2022-05-31 | Discharge: 2022-05-31 | Disposition: A | Discharge status: Home / Self Care | Payer: Managed Care, Other (non HMO) | Attending: Emergency Medicine | Admitting: Emergency Medicine

## 2022-05-31 ENCOUNTER — Other Ambulatory Visit: Payer: Self-pay

## 2022-05-31 DIAGNOSIS — Z79899 Other long term (current) drug therapy: Secondary | ICD-10-CM | POA: Insufficient documentation

## 2022-05-31 DIAGNOSIS — R220 Localized swelling, mass and lump, head: Secondary | ICD-10-CM | POA: Diagnosis present

## 2022-05-31 DIAGNOSIS — R600 Localized edema: Secondary | ICD-10-CM | POA: Insufficient documentation

## 2022-05-31 MED ORDER — DIPHENHYDRAMINE HCL 25 MG PO CAPS
25.0000 mg | ORAL_CAPSULE | Freq: Once | ORAL | Status: AC
  Administered 2022-05-31: 25 mg via ORAL
  Filled 2022-05-31: qty 1

## 2022-05-31 MED ORDER — DEXAMETHASONE 4 MG PO TABS
6.0000 mg | ORAL_TABLET | Freq: Once | ORAL | Status: AC
  Administered 2022-05-31: 6 mg via ORAL
  Filled 2022-05-31: qty 2

## 2022-05-31 NOTE — ED Triage Notes (Signed)
Pt. Stated, I had some dying on my eyebrows and its caused my area were the dye was , its swelling there. I tried washing off but its like my eyes are swelling now.

## 2022-05-31 NOTE — Discharge Instructions (Addendum)
Keep areas around eyes clean and free of make-up for the next several days. Take Benadryl every 6 hours as needed. Swelling should resolve.  If you develop any new or worsening symptoms, don't hesitate to return to the ED.

## 2022-05-31 NOTE — ED Provider Notes (Signed)
[facility] EMERGENCY DEPARTMENT Provider Note   CSN: [id number] Arrival date & time: 05/31/22  1006     History  Chief Complaint  Patient presents with   Facial Swelling    Alannis L Braun is a 41 y.o. female.  HPI Patient presents for periorbital swelling.  He has 2 days ago, she underwent eyebrow vaccine, after dye, and placement of right-sided eyelashes.  Yesterday, she developed localized swelling to areas of the eyebrows with some mild erythema.  Today, we will do periorbital swelling.  He denies any eye discharge, evaluation visual disturbances, eye irritation, pruritus, shortness of breath, nausea.  She has no history of allergic reaction.  She does that she has had similar cosmetic work done before similar effects.    Home Medications Prior to Admission medications   Medication Sig Start Date End Date Taking? Authorizing Provider  amLODipine (NORVASC) 10 MG tablet Take 1 tablet (10 mg total) by mouth daily. 11/28/20   Federico Flake, MD  fluticasone [facility]) 50 MCG/ACT nasal spray Place into both nostrils daily.    [provider]  ibuprofen (ADVIL) 800 MG tablet Take 1 tablet (800 mg total) by mouth every 8 (eight) hours as needed. Patient not taking: Reported on 04/01/2022 01/08/21   Brock Bad, MD  Prenatal Vit-Fe Fumarate-FA (MULTIVITAMIN-PRENATAL) 27-0.8 MG TABS tablet Take 1 tablet by mouth daily at 12 noon. Patient not taking: Reported on 04/01/2022    [provider]  valsartan-hydrochlorothiazide (DIOVAN-HCT) 160-12.5 MG tablet Take 1 tablet by mouth daily.    [provider]      Allergies    Patient has no known allergies.    Review of Systems   Review of Systems  HENT:  Positive for facial swelling.   All other systems reviewed and are negative.   Physical Exam Updated Vital Signs BP 124/84   Pulse 86   Temp (!) 97.4 F (36.3 C) (Oral)   Resp 16   Ht 5\' 6"  (1.676 m)   Wt 76.7 kg   LMP  05/26/2022   SpO2 100%   BMI 27.28 kg/m  Physical Exam Vitals and nursing note reviewed.  Constitutional:      General: She is not in acute distress.    Appearance: Normal appearance. She is well-developed. She is not ill-appearing, toxic-appearing or diaphoretic.  HENT:     Head: Normocephalic and atraumatic.     Right Ear: External ear normal.     Left Ear: External ear normal.     Nose: Nose normal.     Mouth/Throat:     Mouth: Mucous membranes are moist.     Pharynx: Oropharynx is clear.  Eyes:     General: No scleral icterus.       Right eye: No discharge.        Left eye: No discharge.     Extraocular Movements: Extraocular movements intact.     Conjunctiva/sclera: Conjunctivae normal.     Comments: Bilateral periorbital edema  Cardiovascular:     Rate and Rhythm: Normal rate and regular rhythm.     Heart sounds: No murmur heard. Pulmonary:     Effort: Pulmonary effort is normal. No respiratory distress.  Abdominal:     General: There is no distension.     Palpations: Abdomen is soft.  Musculoskeletal:        General: No swelling. Normal range of motion.     Cervical back: Normal range of motion and neck supple.  Skin:  General: Skin is warm and dry.     Capillary Refill: Capillary refill takes less than 2 seconds.     Coloration: Skin is not jaundiced or pale.  Neurological:     General: No focal deficit present.     Mental Status: She is alert and oriented to person, place, and time.     Cranial Nerves: No cranial nerve deficit.     Sensory: No sensory deficit.     Motor: No weakness.     Coordination: Coordination normal.  Psychiatric:        Mood and Affect: Mood normal.        Behavior: Behavior normal.        Thought Content: Thought content normal.        Judgment: Judgment normal.     ED Results / Procedures / Treatments   Labs (all labs ordered are listed, but only abnormal results are displayed) Labs Reviewed - No data to  display  EKG None  Radiology No results found.  Procedures Procedures    Medications Ordered in ED Medications - No data to display  ED Course/ Medical Decision Making/ A&P                           Medical Decision Making  Patient is a healthy 41 year old female presenting for bilateral periorbital edema.  This follows recent cosmetic procedures on her eyebrows and eyelashes.  On assessment, patient is well-appearing.  Equal bilateral periorbital edema is present.  She has had no other symptoms.  Possible etiologies include localized inflammation from eyebrow and/or eyelash procedures versus allergic reaction to chemicals or other environmental allergen.  There is no associated erythema or tenderness.  I do not suspect periorbital cellulitis.  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  She was advised to remove these and any other offending agents, including make-up around her eyes.  Decadron and benadryl were given. She was advised to continue benadryl as needed.  Advised to return if symptoms worsen.  She was discharged in good condition.        Final Clinical Impression(s) / ED Diagnoses Final diagnoses:  Periorbital edema    Rx / DC Orders ED Discharge Orders     None         Gloris Manchester, MD 05/31/22 1141

## 2022-05-31 NOTE — ED Provider Triage Note (Signed)
Emergency Medicine Provider Triage Evaluation Note  Rachael Baker , a 41 y.o. female  was evaluated in triage.  Pt complains of facial swelling.  Patient reports getting her eyebrow wax on Saturday.  She reports she began to notice some swelling in her upper eyelid yesterday.  This morning she noticed the swelling has spread to her lower eyelids and cheeks.  No fever, shortness of breath.  Review of Systems  Positive: As above Negative: As above  Physical Exam  BP 131/79   Pulse (!) 107   Temp 98.7 F (37.1 C) (Oral)   Resp 18   Ht 5\' 6"  (1.676 m)   Wt 76.7 kg   LMP 05/26/2022   SpO2 100%   BMI 27.28 kg/m  Gen:   Awake, no distress   Resp:  Normal effort  MSK:   Moves extremities without difficulty  Other:  Erythema around eyebrows.  Swelling on eyelids and cheeks.  Medical Decision Making  Medically screening exam initiated at 10:39 AM.  Appropriate orders placed.  Arrionna L Tussey was informed that the remainder of the evaluation will be completed by another provider, this initial triage assessment does not replace that evaluation, and the importance of remaining in the ED until their evaluation is complete.     Evelene Croon, Jeanelle Malling 06/01/22 2203

## 2023-11-14 ENCOUNTER — Other Ambulatory Visit: Payer: Self-pay | Admitting: *Deleted

## 2023-11-14 ENCOUNTER — Ambulatory Visit (INDEPENDENT_AMBULATORY_CARE_PROVIDER_SITE_OTHER): Admitting: Gastroenterology

## 2023-11-14 ENCOUNTER — Encounter: Payer: Self-pay | Admitting: Gastroenterology

## 2023-11-14 ENCOUNTER — Encounter: Payer: Self-pay | Admitting: *Deleted

## 2023-11-14 ENCOUNTER — Telehealth: Payer: Self-pay | Admitting: *Deleted

## 2023-11-14 VITALS — BP 126/77 | HR 90 | Temp 98.1°F | Ht 66.0 in | Wt 150.4 lb

## 2023-11-14 DIAGNOSIS — R1310 Dysphagia, unspecified: Secondary | ICD-10-CM | POA: Diagnosis not present

## 2023-11-14 DIAGNOSIS — R1319 Other dysphagia: Secondary | ICD-10-CM | POA: Insufficient documentation

## 2023-11-14 DIAGNOSIS — K219 Gastro-esophageal reflux disease without esophagitis: Secondary | ICD-10-CM

## 2023-11-14 NOTE — H&P (View-Only) (Signed)
 GI Office Note    Referring Provider: Jonathon Neighbors, MD Primary Care Physician:  Jonathon Neighbors, MD  Primary Gastroenterologist:  Chief Complaint   Chief Complaint  Patient presents with   Gastroesophageal Reflux    Recently started pantoprazole 40 mg daily, starting to feel better     History of Present Illness   Rachael Baker is a 43 y.o. female presenting today at the request of Dr. Nida Barrow for GERD.  Patient having symptoms for around one year. Symptoms range from sore throat, to burning in the chest. Usually has one or the other. Has tried several things given by PCP for throat pain without relief. Tried OTC Mylanta and TUMS. Has wondered if related to BP medications as if she misses a dose or two, she feels like symptoms go away. Often has regurgitation of mucous in the mornings, feels it could be due to her amlodipine  she takes at night. No N/V. She has solid food dysphagia to meat. States her PCP told her she had post nasal drip on exam in the past and gave her a nasal spray which she is no longer on. She has seasonal allergies.    Started on pantoprazole 40mg  daily about 2 weeks ago. Some improvement in the throat pain/burning. States PCP has advised her that she cannot be on PPI for more than 8 weeks.  No abdominal pain. Some nausea initially with pantoprazole, but settled down. BM regular. No melena, brbpr.  Medications   Current Outpatient Medications  Medication Sig Dispense Refill   amLODipine  (NORVASC ) 10 MG tablet Take 1 tablet (10 mg total) by mouth daily. 30 tablet 1   montelukast (SINGULAIR) 10 MG tablet Take 10 mg by mouth at bedtime.     pantoprazole (PROTONIX) 40 MG tablet Take 40 mg by mouth daily.     valsartan-hydrochlorothiazide (DIOVAN-HCT) 160-12.5 MG tablet Take 1 tablet by mouth daily.     No current facility-administered medications for this visit.    Allergies   Allergies as of 11/14/2023   (No Known Allergies)    Past Medical History    Past Medical History:  Diagnosis Date   Asthma    HTN (hypertension)     Past Surgical History   Past Surgical History:  Procedure Laterality Date   NO PAST SURGERIES      Past Family History   Family History  Problem Relation Age of Onset   Cancer Mother        breast   Hypertension Mother    Asthma Maternal Grandmother    Hypertension Maternal Grandmother    Hypertension Paternal Grandmother    Diabetes Paternal Grandmother    Diabetes Paternal Aunt    Colon cancer Neg Hx     Past Social History   Social History   Socioeconomic History   Marital status: Married    Spouse name: Not on file   Number of children: 3   Years of education: Not on file   Highest education level: Not on file  Occupational History   Not on file  Tobacco Use   Smoking status: Never   Smokeless tobacco: Never  Vaping Use   Vaping status: Never Used  Substance and Sexual Activity   Alcohol use: No   Drug use: No   Sexual activity: Yes  Other Topics Concern   Not on file  Social History Narrative   Not on file   Social Drivers of Health   Financial Resource Strain: Low Risk  (  06/13/2017)   Overall Financial Resource Strain (CARDIA)    Difficulty of Paying Living Expenses: Not hard at all  Food Insecurity: No Food Insecurity (06/13/2017)   Hunger Vital Sign    Worried About Running Out of Food in the Last Year: Never true    Ran Out of Food in the Last Year: Never true  Transportation Needs: No Transportation Needs (06/13/2017)   PRAPARE - Administrator, Civil Service (Medical): No    Lack of Transportation (Non-Medical): No  Physical Activity: Insufficiently Active (06/13/2017)   Exercise Vital Sign    Days of Exercise per Week: 3 days    Minutes of Exercise per Session: 30 min  Stress: Stress Concern Present (06/13/2017)   [employer] of Occupational Health - Occupational Stress Questionnaire    Feeling of Stress : To some extent  Social Connections:  Socially Integrated (06/13/2017)   Social Connection and Isolation Panel [NHANES]    Frequency of Communication with Friends and Family: Three times a week    Frequency of Social Gatherings with Friends and Family: Three times a week    Attends Religious Services: More than 4 times per year    Active Member of Clubs or Organizations: Yes    Attends Banker Meetings: More than 4 times per year    Marital Status: Married  Catering manager Violence: Not At Risk (06/13/2017)   Humiliation, Afraid, Rape, and Kick questionnaire    Fear of Current or Ex-Partner: No    Emotionally Abused: No    Physically Abused: No    Sexually Abused: No    Review of Systems   General: Negative for anorexia, weight loss, fever, chills, fatigue, weakness. Eyes: Negative for vision changes.  ENT: Negative for hoarseness,   nasal congestion. See hpi CV: Negative for chest pain, angina, palpitations, dyspnea on exertion, peripheral edema.  Respiratory: Negative for dyspnea at rest, dyspnea on exertion, cough, sputum, wheezing.  GI: See history of present illness. GU:  Negative for dysuria, hematuria, urinary incontinence, urinary frequency, nocturnal urination.  MS: Negative for joint pain, low back pain.  Derm: Negative for rash or itching.  Neuro: Negative for weakness, abnormal sensation, seizure, frequent headaches, memory loss,  confusion.  Psych: Negative for anxiety, depression, suicidal ideation, hallucinations.  Endo: Negative for unusual weight change.  Heme: Negative for bruising or bleeding. Allergy: Negative for rash or hives.  Physical Exam   BP 126/77 (BP Location: Right Arm, Patient Position: Sitting, Cuff Size: Normal)   Pulse 90   Temp 98.1 F (36.7 C) (Oral)   Ht 5\' 6"  (1.676 m)   Wt 150 lb 6.4 oz (68.2 kg)   LMP 11/07/2023 (Approximate)   SpO2 98%   BMI 24.28 kg/m    General: Well-nourished, well-developed in no acute distress.  Head: Normocephalic, atraumatic.    Eyes: Conjunctiva pink, no icterus. Mouth: Oropharyngeal mucosa moist and pink  . Neck: Supple without thyromegaly, masses, or lymphadenopathy.  Lungs: Clear to auscultation bilaterally.  Heart: Regular rate and rhythm, no murmurs rubs or gallops.  Abdomen: Bowel sounds are normal, nontender, nondistended, no hepatosplenomegaly or masses,  no abdominal bruits or hernia, no rebound or guarding.   Rectal: not performed Extremities: No lower extremity edema. No clubbing or deformities.  Neuro: Alert and oriented x 4 , grossly normal neurologically.  Skin: Warm and dry, no rash or jaundice.   Psych: Alert and cooperative, normal mood and affect.  Labs   Not performed  Imaging Studies  No results found.  Assessment/Plan:   Throat pain/GERD/dysphagia: symptoms may be multifactorial in setting of allergies/PND as well as reflux. Has noted some improvement since started PPI. Notes that for the past 9-10 months she made significant dietary changes including cutting out fast foods, no soda, no coffee, has lost 15 pounds but still struggling with these symptoms. Cannot rule out EOE contributing.    -EGD/ED with Dr. Mordechai April. ASA 2.I have discussed the risks, alternatives, benefits with regards to but not limited to the risk of reaction to medication, bleeding, infection, perforation and the patient is agreeable to proceed. Written consent to be obtained. -continue pantoprazole 40mg  daily. -continue anti-reflux measures  Colon cancer screening: colonoscopy at age 53   Trudie Fuse. Harles Lied, MHS, PA-C [facility]

## 2023-11-14 NOTE — Progress Notes (Signed)
 GI Office Note    Referring Provider: Jonathon Neighbors, MD Primary Care Physician:  Jonathon Neighbors, MD  Primary Gastroenterologist:  Chief Complaint   Chief Complaint  Patient presents with   Gastroesophageal Reflux    Recently started pantoprazole 40 mg daily, starting to feel better     History of Present Illness   Rachael Baker is a 43 y.o. female presenting today at the request of Dr. Nida Barrow for GERD.  Patient having symptoms for around one year. Symptoms range from sore throat, to burning in the chest. Usually has one or the other. Has tried several things given by PCP for throat pain without relief. Tried OTC Mylanta and TUMS. Has wondered if related to BP medications as if she misses a dose or two, she feels like symptoms go away. Often has regurgitation of mucous in the mornings, feels it could be due to her amlodipine  she takes at night. No N/V. She has solid food dysphagia to meat. States her PCP told her she had post nasal drip on exam in the past and gave her a nasal spray which she is no longer on. She has seasonal allergies.    Started on pantoprazole 40mg  daily about 2 weeks ago. Some improvement in the throat pain/burning. States PCP has advised her that she cannot be on PPI for more than 8 weeks.  No abdominal pain. Some nausea initially with pantoprazole, but settled down. BM regular. No melena, brbpr.  Medications   Current Outpatient Medications  Medication Sig Dispense Refill   amLODipine  (NORVASC ) 10 MG tablet Take 1 tablet (10 mg total) by mouth daily. 30 tablet 1   montelukast (SINGULAIR) 10 MG tablet Take 10 mg by mouth at bedtime.     pantoprazole (PROTONIX) 40 MG tablet Take 40 mg by mouth daily.     valsartan-hydrochlorothiazide (DIOVAN-HCT) 160-12.5 MG tablet Take 1 tablet by mouth daily.     No current facility-administered medications for this visit.    Allergies   Allergies as of 11/14/2023   (No Known Allergies)    Past Medical History    Past Medical History:  Diagnosis Date   Asthma    HTN (hypertension)     Past Surgical History   Past Surgical History:  Procedure Laterality Date   NO PAST SURGERIES      Past Family History   Family History  Problem Relation Age of Onset   Cancer Mother        breast   Hypertension Mother    Asthma Maternal Grandmother    Hypertension Maternal Grandmother    Hypertension Paternal Grandmother    Diabetes Paternal Grandmother    Diabetes Paternal Aunt    Colon cancer Neg Hx     Past Social History   Social History   Socioeconomic History   Marital status: Married    Spouse name: Not on file   Number of children: 3   Years of education: Not on file   Highest education level: Not on file  Occupational History   Not on file  Tobacco Use   Smoking status: Never   Smokeless tobacco: Never  Vaping Use   Vaping status: Never Used  Substance and Sexual Activity   Alcohol use: No   Drug use: No   Sexual activity: Yes  Other Topics Concern   Not on file  Social History Narrative   Not on file   Social Drivers of Health   Financial Resource Strain: Low Risk  (  06/13/2017)   Overall Financial Resource Strain (CARDIA)    Difficulty of Paying Living Expenses: Not hard at all  Food Insecurity: No Food Insecurity (06/13/2017)   Hunger Vital Sign    Worried About Running Out of Food in the Last Year: Never true    Ran Out of Food in the Last Year: Never true  Transportation Needs: No Transportation Needs (06/13/2017)   PRAPARE - Administrator, Civil Service (Medical): No    Lack of Transportation (Non-Medical): No  Physical Activity: Insufficiently Active (06/13/2017)   Exercise Vital Sign    Days of Exercise per Week: 3 days    Minutes of Exercise per Session: 30 min  Stress: Stress Concern Present (06/13/2017)   [employer] of Occupational Health - Occupational Stress Questionnaire    Feeling of Stress : To some extent  Social Connections:  Socially Integrated (06/13/2017)   Social Connection and Isolation Panel [NHANES]    Frequency of Communication with Friends and Family: Three times a week    Frequency of Social Gatherings with Friends and Family: Three times a week    Attends Religious Services: More than 4 times per year    Active Member of Clubs or Organizations: Yes    Attends Banker Meetings: More than 4 times per year    Marital Status: Married  Catering manager Violence: Not At Risk (06/13/2017)   Humiliation, Afraid, Rape, and Kick questionnaire    Fear of Current or Ex-Partner: No    Emotionally Abused: No    Physically Abused: No    Sexually Abused: No    Review of Systems   General: Negative for anorexia, weight loss, fever, chills, fatigue, weakness. Eyes: Negative for vision changes.  ENT: Negative for hoarseness,   nasal congestion. See hpi CV: Negative for chest pain, angina, palpitations, dyspnea on exertion, peripheral edema.  Respiratory: Negative for dyspnea at rest, dyspnea on exertion, cough, sputum, wheezing.  GI: See history of present illness. GU:  Negative for dysuria, hematuria, urinary incontinence, urinary frequency, nocturnal urination.  MS: Negative for joint pain, low back pain.  Derm: Negative for rash or itching.  Neuro: Negative for weakness, abnormal sensation, seizure, frequent headaches, memory loss,  confusion.  Psych: Negative for anxiety, depression, suicidal ideation, hallucinations.  Endo: Negative for unusual weight change.  Heme: Negative for bruising or bleeding. Allergy: Negative for rash or hives.  Physical Exam   BP 126/77 (BP Location: Right Arm, Patient Position: Sitting, Cuff Size: Normal)   Pulse 90   Temp 98.1 F (36.7 C) (Oral)   Ht 5\' 6"  (1.676 m)   Wt 150 lb 6.4 oz (68.2 kg)   LMP 11/07/2023 (Approximate)   SpO2 98%   BMI 24.28 kg/m    General: Well-nourished, well-developed in no acute distress.  Head: Normocephalic, atraumatic.    Eyes: Conjunctiva pink, no icterus. Mouth: Oropharyngeal mucosa moist and pink  . Neck: Supple without thyromegaly, masses, or lymphadenopathy.  Lungs: Clear to auscultation bilaterally.  Heart: Regular rate and rhythm, no murmurs rubs or gallops.  Abdomen: Bowel sounds are normal, nontender, nondistended, no hepatosplenomegaly or masses,  no abdominal bruits or hernia, no rebound or guarding.   Rectal: not performed Extremities: No lower extremity edema. No clubbing or deformities.  Neuro: Alert and oriented x 4 , grossly normal neurologically.  Skin: Warm and dry, no rash or jaundice.   Psych: Alert and cooperative, normal mood and affect.  Labs   Not performed  Imaging Studies  No results found.  Assessment/Plan:   Throat pain/GERD/dysphagia: symptoms may be multifactorial in setting of allergies/PND as well as reflux. Has noted some improvement since started PPI. Notes that for the past 9-10 months she made significant dietary changes including cutting out fast foods, no soda, no coffee, has lost 15 pounds but still struggling with these symptoms. Cannot rule out EOE contributing.    -EGD/ED with Dr. Mordechai April. ASA 2.I have discussed the risks, alternatives, benefits with regards to but not limited to the risk of reaction to medication, bleeding, infection, perforation and the patient is agreeable to proceed. Written consent to be obtained. -continue pantoprazole 40mg  daily. -continue anti-reflux measures  Colon cancer screening: colonoscopy at age 53   Trudie Fuse. Harles Lied, MHS, PA-C [facility]

## 2023-11-14 NOTE — Patient Instructions (Signed)
 Continue pantoprazole 40mg  daily before breakfast. Upper endoscopy in near future. Colonoscopy at age 43 for screening.

## 2023-11-14 NOTE — Telephone Encounter (Signed)
 Called [employer] management: No PA needed for EGD/ED. Ref # [id number]

## 2023-11-26 LAB — BASIC METABOLIC PANEL WITH GFR
BUN/Creatinine Ratio: 17 (ref 9–23)
BUN: 15 mg/dL (ref 6–24)
CO2: 22 mmol/L (ref 20–29)
Calcium: 9.7 mg/dL (ref 8.7–10.2)
Chloride: 100 mmol/L (ref 96–106)
Creatinine, Ser: 0.88 mg/dL (ref 0.57–1.00)
Glucose: 90 mg/dL (ref 70–99)
Potassium: 4 mmol/L (ref 3.5–5.2)
Sodium: 136 mmol/L (ref 134–144)
eGFR: 85 mL/min/{1.73_m2} (ref >59)

## 2023-11-26 LAB — PREGNANCY, URINE: Preg Test, Ur: NEGATIVE

## 2023-12-02 ENCOUNTER — Other Ambulatory Visit: Payer: Self-pay

## 2023-12-02 ENCOUNTER — Ambulatory Visit (HOSPITAL_COMMUNITY): Payer: Self-pay | Admitting: Anesthesiology

## 2023-12-02 ENCOUNTER — Encounter (HOSPITAL_COMMUNITY)
Admission: RE | Disposition: A | Discharge status: Home / Self Care | Payer: Self-pay | Source: Home / Self Care | Attending: Internal Medicine

## 2023-12-02 ENCOUNTER — Ambulatory Visit (HOSPITAL_COMMUNITY)
Admission: RE | Admit: 2023-12-02 | Discharge: 2023-12-02 | Disposition: A | Discharge status: Home / Self Care | Attending: Internal Medicine | Admitting: Internal Medicine

## 2023-12-02 ENCOUNTER — Encounter (HOSPITAL_COMMUNITY): Payer: Self-pay | Admitting: Internal Medicine

## 2023-12-02 DIAGNOSIS — K222 Esophageal obstruction: Secondary | ICD-10-CM | POA: Diagnosis not present

## 2023-12-02 DIAGNOSIS — R1310 Dysphagia, unspecified: Secondary | ICD-10-CM | POA: Insufficient documentation

## 2023-12-02 DIAGNOSIS — K2100 Gastro-esophageal reflux disease with esophagitis, without bleeding: Secondary | ICD-10-CM | POA: Diagnosis not present

## 2023-12-02 DIAGNOSIS — R12 Heartburn: Secondary | ICD-10-CM | POA: Diagnosis not present

## 2023-12-02 DIAGNOSIS — K319 Disease of stomach and duodenum, unspecified: Secondary | ICD-10-CM | POA: Diagnosis not present

## 2023-12-02 DIAGNOSIS — K2950 Unspecified chronic gastritis without bleeding: Secondary | ICD-10-CM | POA: Diagnosis not present

## 2023-12-02 DIAGNOSIS — I10 Essential (primary) hypertension: Secondary | ICD-10-CM | POA: Insufficient documentation

## 2023-12-02 DIAGNOSIS — Z79899 Other long term (current) drug therapy: Secondary | ICD-10-CM | POA: Insufficient documentation

## 2023-12-02 HISTORY — PX: ESOPHAGEAL DILATION: SHX303

## 2023-12-02 HISTORY — PX: ESOPHAGOGASTRODUODENOSCOPY: SHX5428

## 2023-12-02 SURGERY — EGD (ESOPHAGOGASTRODUODENOSCOPY)
Anesthesia: General

## 2023-12-02 MED ORDER — PANTOPRAZOLE SODIUM 40 MG PO TBEC: 40.0000 mg | DELAYED_RELEASE_TABLET | Freq: Two times a day (BID) | ORAL | 11 refills | Status: DC

## 2023-12-02 MED ORDER — LACTATED RINGERS IV SOLN: INTRAVENOUS | Status: DC

## 2023-12-02 MED ORDER — LIDOCAINE 2% (20 MG/ML) 5 ML SYRINGE
INTRAMUSCULAR | Status: DC | PRN
  Administered 2023-12-02: 60 mg via INTRAVENOUS

## 2023-12-02 MED ORDER — EPHEDRINE SULFATE-NACL 50-0.9 MG/10ML-% IV SOSY
PREFILLED_SYRINGE | INTRAVENOUS | Status: DC | PRN
  Administered 2023-12-02: 10 mg via INTRAVENOUS

## 2023-12-02 MED ORDER — PHENYLEPHRINE 80 MCG/ML (10ML) SYRINGE FOR IV PUSH (FOR BLOOD PRESSURE SUPPORT)
PREFILLED_SYRINGE | INTRAVENOUS | Status: DC | PRN
Start: 2023-12-02 — End: 2023-12-02
  Administered 2023-12-02: 160 ug via INTRAVENOUS

## 2023-12-02 MED ORDER — PROPOFOL 10 MG/ML IV BOLUS
INTRAVENOUS | Status: DC | PRN
  Administered 2023-12-02: 125 ug/kg/min via INTRAVENOUS
  Administered 2023-12-02 (×2): 50 mg via INTRAVENOUS
  Administered 2023-12-02: 100 mg via INTRAVENOUS

## 2023-12-02 NOTE — Discharge Instructions (Addendum)
 EGD Discharge instructions Please read the instructions outlined below and refer to this sheet in the next few weeks. These discharge instructions provide you with general information on caring for yourself after you leave the hospital. Your doctor may also give you specific instructions. While your treatment has been planned according to the most current medical practices available, unavoidable complications occasionally occur. If you have any problems or questions after discharge, please call your doctor. ACTIVITY You may resume your regular activity but move at a slower pace for the next 24 hours.  Take frequent rest periods for the next 24 hours.  Walking will help expel (get rid of) the air and reduce the bloated feeling in your abdomen.  No driving for 24 hours (because of the anesthesia (medicine) used during the test).  You may shower.  Do not sign any important legal documents or operate any machinery for 24 hours (because of the anesthesia used during the test).  NUTRITION Drink plenty of fluids.  You may resume your normal diet.  Begin with a light meal and progress to your normal diet.  Avoid alcoholic beverages for 24 hours or as instructed by your caregiver.  MEDICATIONS You may resume your normal medications unless your caregiver tells you otherwise.  WHAT YOU CAN EXPECT TODAY You may experience abdominal discomfort such as a feeling of fullness or "gas" pains.  FOLLOW-UP Your doctor will discuss the results of your test with you.  SEEK IMMEDIATE MEDICAL ATTENTION IF ANY OF THE FOLLOWING OCCUR: Excessive nausea (feeling sick to your stomach) and/or vomiting.  Severe abdominal pain and distention (swelling).  Trouble swallowing.  Temperature over 101 F (37.8 C).  Rectal bleeding or vomiting of blood.   Your EGD revealed mild amount inflammation in your stomach.  I took biopsies of this to rule out infection with a bacteria called H. pylori. I also took samples of your  esophagus. Await pathology results, my office will contact you.  You had a tightening of your esophagus called a Schatzki's ring.  I stretched this out today.  Hopefully this helps with feeling of food getting stuck.  Small bowel appeared normal.  I am going to increase your pantoprazole to twice daily for the next 12 weeks at which point you can decrease back down to once daily as tolerated.  Follow-up in GI office in 8 weeks. Message sent to office and will schedule appointment.   I hope you have a great rest of your week!  Rachael Baker. Rachael Baker, D.O. Gastroenterology and Hepatology [facility]

## 2023-12-02 NOTE — Anesthesia Preprocedure Evaluation (Signed)
Anesthesia Evaluation  Patient identified by MRN, date of birth, ID band Patient awake    Reviewed: Allergy & Precautions, H&P , NPO status , Patient's Chart, lab work & pertinent test results, reviewed documented beta blocker date and time   Airway Mallampati: II  TM Distance: >3 FB Neck ROM: full    Dental no notable dental hx.    Pulmonary neg pulmonary ROS, asthma    Pulmonary exam normal breath sounds clear to auscultation       Cardiovascular Exercise Tolerance: Good hypertension, negative cardio ROS  Rhythm:regular Rate:Normal     Neuro/Psych negative neurological ROS  negative psych ROS   GI/Hepatic negative GI ROS, Neg liver ROS,GERD  ,,  Endo/Other  negative endocrine ROS    Renal/GU negative Renal ROS  negative genitourinary   Musculoskeletal   Abdominal   Peds  Hematology negative hematology ROS (+)   Anesthesia Other Findings   Reproductive/Obstetrics negative OB ROS                             Anesthesia Physical Anesthesia Plan  ASA: 2  Anesthesia Plan: General   Post-op Pain Management:    Induction:   PONV Risk Score and Plan: Propofol infusion  Airway Management Planned:   Additional Equipment:   Intra-op Plan:   Post-operative Plan:   Informed Consent: I have reviewed the patients History and Physical, chart, labs and discussed the procedure including the risks, benefits and alternatives for the proposed anesthesia with the patient or authorized representative who has indicated his/her understanding and acceptance.     Dental Advisory Given  Plan Discussed with: CRNA  Anesthesia Plan Comments:        Anesthesia Quick Evaluation  

## 2023-12-02 NOTE — Op Note (Signed)
 [facility] Patient Name: Rachael Baker Procedure Date: 12/02/2023 1:20 PM MRN: [id number] Date of Birth: [birth year] Attending MD: Carlin POUR. Cindie , [place], [id number] CSN: [id number] Age: 43 Admit Type: Outpatient Procedure:                Upper GI endoscopy Indications:              Dysphagia, Heartburn Providers:                Carlin POUR. Cindie, DO, Ashley Goins, Daphne Mulch                            Technician, Technician Referring MD:              Medicines:                See the Anesthesia note for documentation of the                            administered medications Complications:            No immediate complications. Estimated Blood Loss:     Estimated blood loss was minimal. Procedure:                Pre-Anesthesia Assessment:                           - The anesthesia plan was to use monitored                            anesthesia care (MAC).                           After obtaining informed consent, the endoscope was                            passed under direct vision. Throughout the                            procedure, the patient's blood pressure, pulse, and                            oxygen saturations were monitored continuously. The                            GIF-H190 ([id number]) scope was introduced through the                            mouth, and advanced to the second part of duodenum.                            The upper GI endoscopy was accomplished without                            difficulty. The patient tolerated the procedure                            well. Scope In: 1:35:26 PM  Scope Out: 1:41:56 PM Total Procedure Duration: 0 hours 6 minutes 30 seconds  Findings:      A mild Schatzki ring was found in the distal esophagus. A TTS dilator       was passed through the scope. Dilation with an 18-19-20 mm balloon       dilator was performed to 20 mm. The dilation site was examined and       showed mild mucosal disruption and moderate  improvement in luminal       narrowing.      Biopsies were taken with a cold forceps in the middle third of the       esophagus for histology.      Moderate inflammation characterized by erosions and erythema was found       in the gastric antrum. Biopsies were taken with a cold forceps for       Helicobacter pylori testing.      The duodenal bulb, first portion of the duodenum and second portion of       the duodenum were normal. Impression:               - Mild Schatzki ring. Dilated.                           - Gastritis. Biopsied.                           - Normal duodenal bulb, first portion of the                            duodenum and second portion of the duodenum.                           - Biopsies were taken with a cold forceps for                            histology in the middle third of the esophagus. Moderate Sedation:      Per Anesthesia Care Recommendation:           - Patient has a contact number available for                            emergencies. The signs and symptoms of potential                            delayed complications were discussed with the                            patient. Return to normal activities tomorrow.                            Written discharge instructions were provided to the                            patient.                           - Resume previous diet.                           -  Continue present medications.                           - Await pathology results.                           - Repeat upper endoscopy PRN for retreatment.                           - Return to GI clinic in 8 weeks.                           - Use Protonix (pantoprazole) 40 mg PO BID for 12                            weeks. Procedure Code(s):        --- Professional ---                           [id number], Esophagogastroduodenoscopy, flexible,                            transoral; with transendoscopic balloon dilation of                             esophagus (less than 30 mm diameter)                           43239, 59, Esophagogastroduodenoscopy, flexible,                            transoral; with biopsy, single or multiple Diagnosis Code(s):        --- Professional ---                           K22.2, Esophageal obstruction                           K29.70, Gastritis, unspecified, without bleeding                           R13.10, Dysphagia, unspecified                           R12, Heartburn CPT copyright 2022 [employer]. All rights reserved. The codes documented in this report are preliminary and upon coder review may  be revised to meet current compliance requirements. Carlin POUR. Cindie, DO Carlin POUR. Cindie, DO 12/02/2023 1:45:09 PM This report has been signed electronically. Number of Addenda: 0

## 2023-12-02 NOTE — Anesthesia Procedure Notes (Signed)
 Date/Time: 12/02/2023 1:31 PM  Performed by: Para Jerelene CROME, CRNAOxygen Delivery Method: Nasal cannula Comments: OptiFlow Nasal Cannula.

## 2023-12-02 NOTE — Transfer of Care (Addendum)
 Immediate Anesthesia Transfer of Care Note  Patient: Rachael Baker  Procedure(s) Performed: EGD (ESOPHAGOGASTRODUODENOSCOPY) DILATION, ESOPHAGUS  Patient Location: Endoscopy Unit  Anesthesia Type:General  Level of Consciousness: drowsy and patient cooperative  Airway & Oxygen Therapy: Patient Spontanous Breathing  Post-op Assessment: Report given to RN and Post -op Vital signs reviewed and stable  Post vital signs: Reviewed and stable  Last Vitals:  Vitals Value Taken Time  BP 110/64 12/02/23   1347  Temp 36.6 12/02/23   1347  Pulse 60 12/02/23   1347  Resp 20 12/02/23   1347  SpO2 100% 12/02/23   1347    Last Pain:  Vitals:   12/02/23 1329  TempSrc:   PainSc: 0-No pain      Patients Stated Pain Goal: 3 (12/02/23 1209)  Complications: No notable events documented.

## 2023-12-02 NOTE — Interval H&P Note (Signed)
 History and Physical Interval Note:  12/02/2023 12:59 PM  Rachael Baker  has presented today for surgery, with the diagnosis of dysphagia,GERD.  The various methods of treatment have been discussed with the patient and family. After consideration of risks, benefits and other options for treatment, the patient has consented to  Procedure(s) with comments: EGD (ESOPHAGOGASTRODUODENOSCOPY) (N/A) - 1:30 pm, asa 2 DILATION, ESOPHAGUS (N/A) as a surgical intervention.  The patient's history has been reviewed, patient examined, no change in status, stable for surgery.  I have reviewed the patient's chart and labs.  Questions were answered to the patient's satisfaction.     Carlin MARLA Hasty

## 2023-12-05 LAB — SURGICAL PATHOLOGY

## 2023-12-05 NOTE — Anesthesia Postprocedure Evaluation (Signed)
 Anesthesia Post Note  Patient: Rachael Baker  Procedure(s) Performed: EGD (ESOPHAGOGASTRODUODENOSCOPY) DILATION, ESOPHAGUS  Patient location during evaluation: Endoscopy Anesthesia Type: General Level of consciousness: awake and alert, oriented and patient cooperative Pain management: pain level controlled Vital Signs Assessment: post-procedure vital signs reviewed and stable Respiratory status: spontaneous breathing Cardiovascular status: blood pressure returned to baseline Anesthetic complications: no  No notable events documented.   Last Vitals:  Vitals:   12/02/23 1209 12/02/23 1347  BP: 122/68 110/64  Pulse: 85 60  Resp: 17 20  Temp: (!) 36.3 C 36.6 C  SpO2: 100% 100%    Last Pain:  Vitals:   12/02/23 1347  TempSrc: Oral  PainSc: 0-No pain                 Belinda L Kaytlen Lightsey

## 2023-12-06 ENCOUNTER — Ambulatory Visit: Payer: Self-pay | Admitting: Internal Medicine

## 2023-12-07 ENCOUNTER — Encounter (HOSPITAL_COMMUNITY): Payer: Self-pay | Admitting: Internal Medicine

## 2024-01-26 NOTE — Progress Notes (Signed)
 GI Office Note    Referring Provider: Benjamine Aland, MD Primary Care Physician:  Benjamine Aland, MD  Primary Gastroenterologist: Carlin POUR. Cindie, DO   Chief Complaint   Chief Complaint  Patient presents with   Follow-up    Follow up after EGD. Pt states she is doing better    History of Present Illness   Rachael Baker is a 43 y.o. female presenting today for follow up. Last seen in June. History of GERD, dysphagia, throat pain in setting of PND/allergies.   Discussed the use of AI scribe software for clinical note transcription with the patient, who gave verbal consent to proceed.  She has experienced improvement in her GERD symptoms since starting pantoprazole , with a reduction in the sensation of food being stuck in her chest or throat and decreased heartburn. Noted improvement in dysphagia since esophageal dilation of Schatzki ring. However, she sometimes experiences achy upper back pain, described as tension. Wondered if could be reflux related. She suspects might be related to her work posture as she works from home and sits in a chair all day. Denies abdominal pain or bowel concerns. No melena, brbpr.   She continues to experience a sore throat with a lot of mucus, particularly in the morning, and wonders if this is related to reflux. She recalls being previously prescribed a nasal spray for postnasal drip, which was discontinued by PCP.   She is currently taking pantoprazole  twice a day.     Prior Data   EGD 11/2023: -mild Schatzki ring s/p dilation -gastritis s/p bx reactive gastropathy with mild chronic gastritis. No h.pylori -normal duodenal bulb, first portion of duodenum, second portion of duodenum -esophageal bx from middle of esophagus, reflux esophagitis (3 eos/hfp)     Medications   Current Outpatient Medications  Medication Sig Dispense Refill   amLODipine  (NORVASC ) 10 MG tablet Take 1 tablet (10 mg total) by mouth daily. 30 tablet 1    montelukast (SINGULAIR) 10 MG tablet Take 10 mg by mouth at bedtime.     pantoprazole  (PROTONIX ) 40 MG tablet Take 1 tablet (40 mg total) by mouth 2 (two) times daily. 60 tablet 11   valsartan-hydrochlorothiazide (DIOVAN-HCT) 160-12.5 MG tablet Take 1 tablet by mouth daily.     No current facility-administered medications for this visit.    Allergies   Allergies as of 01/27/2024   (No Known Allergies)      Review of Systems   General: Negative for anorexia, weight loss, fever, chills, fatigue, weakness. ENT: Negative for hoarseness, difficulty swallowing , nasal congestion. See hpi CV: Negative for chest pain, angina, palpitations, dyspnea on exertion, peripheral edema.  Respiratory: Negative for dyspnea at rest, dyspnea on exertion, cough, sputum, wheezing.  GI: See history of present illness. GU:  Negative for dysuria, hematuria, urinary incontinence, urinary frequency, nocturnal urination.  Endo: Negative for unusual weight change.     Physical Exam   BP 116/76   Pulse 99   Temp 98.6 F (37 C)   Ht 5' 7 (1.702 m)   Wt 149 lb (67.6 kg)   LMP 01/20/2024   BMI 23.34 kg/m    General: Well-nourished, well-developed in no acute distress.  Eyes: No icterus. Mouth: Oropharyngeal mucosa moist and pink   Abdomen: Bowel sounds are normal, nontender, nondistended, no hepatosplenomegaly or masses,  no abdominal bruits or hernia , no rebound or guarding.  Rectal: not performed Extremities: No lower extremity edema. No clubbing or deformities. Neuro: Alert and oriented x  4   Skin: Warm and dry, no jaundice.   Psych: Alert and cooperative, normal mood and affect.  Labs   None  Imaging Studies   No results found.  Assessment/Plan:   Gastroesophageal reflux disease: Improvement in symptoms with pantoprazole , including reduced sensation of food stuck in chest and decreased heartburn. Persistent sore throat with mucus, may be due to PND but cannot rule out LPR. Recent EGD  with esophageal biopsy c/w reflux esophagitis.  - Continue pantoprazole  twice daily for at least 12 weeks, may consider reduction to one daily after next visit - Consider adding Reflux Gourmet Rescue at night to prevent nocturnal reflux - Evaluate response to treatment in 2 months; consider ENT referral if no improvement - Reinforced anti-reflux symptoms -return ov in 2 months  Esophageal dysphagia: -Schatzki ring, status post dilation -symptoms resolved        Sonny RAMAN. Ezzard, MHS, PA-C [facility]

## 2024-01-27 ENCOUNTER — Ambulatory Visit: Admitting: Gastroenterology

## 2024-01-27 VITALS — BP 116/76 | HR 99 | Temp 98.6°F | Ht 67.0 in | Wt 149.0 lb

## 2024-01-27 DIAGNOSIS — K219 Gastro-esophageal reflux disease without esophagitis: Secondary | ICD-10-CM

## 2024-01-27 DIAGNOSIS — R1319 Other dysphagia: Secondary | ICD-10-CM | POA: Diagnosis not present

## 2024-01-27 NOTE — Patient Instructions (Signed)
 Continue pantoprazole  40mg  twice daily before a meal. Add Reflux Gourmet Rescue one teaspoon at bedtime. Return to the office in 2 months for follow up. Call if you have any questions or concerns.

## 2024-02-21 ENCOUNTER — Encounter: Payer: Self-pay | Admitting: Gastroenterology

## 2024-05-09 ENCOUNTER — Encounter: Payer: Self-pay | Admitting: Gastroenterology

## 2024-05-09 ENCOUNTER — Ambulatory Visit: Admitting: Gastroenterology

## 2024-05-09 VITALS — BP 123/77 | HR 87 | Temp 98.8°F | Ht 67.0 in | Wt 150.0 lb

## 2024-05-09 DIAGNOSIS — R1319 Other dysphagia: Secondary | ICD-10-CM

## 2024-05-09 DIAGNOSIS — K2100 Gastro-esophageal reflux disease with esophagitis, without bleeding: Secondary | ICD-10-CM | POA: Diagnosis not present

## 2024-05-09 MED ORDER — PANTOPRAZOLE SODIUM 40 MG PO TBEC: 40.0000 mg | DELAYED_RELEASE_TABLET | Freq: Every day | ORAL | 3 refills | Status: AC

## 2024-05-09 NOTE — Progress Notes (Signed)
 GI Office Note    Referring Provider: Benjamine Aland, MD Primary Care Physician:  Benjamine Aland, MD  Primary Gastroenterologist: Carlin POUR. Cindie, DO   Chief Complaint   Chief Complaint  Patient presents with   Follow-up    Doing well, no issues    History of Present Illness   Rachael Baker is a 43 y.o. female presenting today for follow up. Last seen in 01/2024. History of GERD, dysphagia, throat pain in setting of PND/allergies.   Discussed the use of AI scribe software for clinical note transcription with the patient, who gave verbal consent to proceed.  History of Present Illness Rachael Baker is a 43 year old female with chronic acid reflux who presents for follow-up regarding her reflux management.  She has been experiencing chronic acid reflux for about a year and a half. EGD showed gastritis, reflux esophagitis (on biopsy), and schatzki ring. Initially, she was on medication twice daily for twelve weeks following her EGD. Since September, she has reduced her medication to once daily.  Her symptoms have improved significantly. No heartburn or dysphagia. No longer having mucous in her throat. No abdominal pain. BMs regular. Previously most of her reflux symptoms were first thing in the morning.       Prior Data     Results    EGD 11/2023: -mild Schatzki ring s/p dilation -gastritis s/p bx reactive gastropathy with mild chronic gastritis. No h.pylori -normal duodenal bulb, first portion of duodenum, second portion of duodenum -esophageal bx from middle of esophagus, reflux esophagitis (3 eos/hfp)    Medications   Current Outpatient Medications  Medication Sig Dispense Refill   amLODipine  (NORVASC ) 10 MG tablet Take 1 tablet (10 mg total) by mouth daily. 30 tablet 1   montelukast (SINGULAIR) 10 MG tablet Take 10 mg by mouth at bedtime.     pantoprazole  (PROTONIX ) 40 MG tablet Take 1 tablet (40 mg total) by mouth 2 (two) times daily. 60 tablet 11    valsartan-hydrochlorothiazide (DIOVAN-HCT) 160-12.5 MG tablet Take 1 tablet by mouth daily.     No current facility-administered medications for this visit.    Allergies   Allergies as of 05/09/2024   (No Known Allergies)      Review of Systems   General: Negative for anorexia, weight loss, fever, chills, fatigue, weakness. ENT: Negative for hoarseness, difficulty swallowing , nasal congestion. CV: Negative for chest pain, angina, palpitations, dyspnea on exertion, peripheral edema.  Respiratory: Negative for dyspnea at rest, dyspnea on exertion, cough, sputum, wheezing.  GI: See history of present illness. GU:  Negative for dysuria, hematuria, urinary incontinence, urinary frequency, nocturnal urination.  Endo: Negative for unusual weight change.     Physical Exam   BP 123/77   Pulse 87   Temp 98.8 F (37.1 C) (Oral)   Ht 5' 7 (1.702 m)   Wt 150 lb (68 kg)   LMP  (LMP Unknown)   SpO2 98%   BMI 23.49 kg/m    General: Well-nourished, well-developed in no acute distress.  Eyes: No icterus. Mouth: Oropharyngeal mucosa moist and pink   Skin: Warm and dry, no jaundice.   Psych: Alert and cooperative, normal mood and affect.  Labs   None available  Imaging Studies   No results found.  Assessment/Plan:    Assessment & Plan Gastroesophageal reflux disease/esophagitis/Schatzki ring/gastritis: She completed 12 weeks of PPI BID. She reduced PPI to once daily in 02/2024. She is doing well. She is using Reflux  Gourmet Rescue at bedtime and this has helped her nocturnal symptoms greatly. She would like to try coming off PPI.   - Wean off acid reflux medication by taking it every other day for a week, then three times a week for a week, and stop if symptoms remain controlled. - Use Reflex Gourmet at night to manage symptoms. - Consider quick-acting over-the-counter medications like Pepcid or Tums for occasional heartburn. - Monitor symptoms and resume medication if symptoms  worsen. - If she requires staying on PPI, we will plan to see her back in one year.    Colorectal cancer screening: Recommend colonoscopy at age 41.           Sonny RAMAN. Ezzard, MHS, PA-C [facility]

## 2024-05-09 NOTE — Patient Instructions (Signed)
 If you want to try and stop pantoprazole , I would recommend weaning off. You can drop to one pill every other day for one week, if tolerated then decrease to one pill three times per week and then stop. If you have recurrent heartburn, reflux, swallowing issues, you will need to go back on pantoprazole  once daily. For occasional heartburn, you can use TUMS, Pepcid OTC.  Continue Reflux Gourmet at bedtime.  We will see you back as needed; however, IF you have to go back on pantoprazole , we will see you back in one year.   I have sent in refills of pantoprazole  in case you need it.
